# Patient Record
Sex: Female | Born: 1955 | Race: White | Hispanic: No | Marital: Married | Smoking: Former smoker
Health system: Southern US, Community
[De-identification: ages and names within clinical notes are randomized; demographics above are authoritative.]

## PROBLEM LIST (undated history)

## (undated) DIAGNOSIS — F329 Major depressive disorder, single episode, unspecified: Secondary | ICD-10-CM

## (undated) DIAGNOSIS — G43909 Migraine, unspecified, not intractable, without status migrainosus: Secondary | ICD-10-CM

## (undated) DIAGNOSIS — K219 Gastro-esophageal reflux disease without esophagitis: Secondary | ICD-10-CM

## (undated) DIAGNOSIS — H269 Unspecified cataract: Secondary | ICD-10-CM

## (undated) DIAGNOSIS — J449 Chronic obstructive pulmonary disease, unspecified: Secondary | ICD-10-CM

## (undated) DIAGNOSIS — Z8489 Family history of other specified conditions: Secondary | ICD-10-CM

## (undated) DIAGNOSIS — F32A Depression, unspecified: Secondary | ICD-10-CM

## (undated) DIAGNOSIS — F419 Anxiety disorder, unspecified: Secondary | ICD-10-CM

## (undated) HISTORY — DX: Migraine, unspecified, not intractable, without status migrainosus: G43.909

## (undated) HISTORY — DX: Family history of other specified conditions: Z84.89

## (undated) HISTORY — DX: Anxiety disorder, unspecified: F41.9

## (undated) HISTORY — DX: Major depressive disorder, single episode, unspecified: F32.9

## (undated) HISTORY — DX: Unspecified cataract: H26.9

## (undated) HISTORY — DX: Depression, unspecified: F32.A

## (undated) HISTORY — DX: Gastro-esophageal reflux disease without esophagitis: K21.9

## (undated) HISTORY — DX: Chronic obstructive pulmonary disease, unspecified: J44.9

---
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
## 1980-07-01 HISTORY — PX: BREAST ENHANCEMENT SURGERY: SHX7

## 1987-07-02 HISTORY — PX: OTHER SURGICAL HISTORY: SHX169

## 2009-07-13 ENCOUNTER — Ambulatory Visit (HOSPITAL_BASED_OUTPATIENT_CLINIC_OR_DEPARTMENT_OTHER): Admission: RE | Admit: 2009-07-13 | Discharge: 2009-07-13 | Payer: Self-pay | Admitting: Urology

## 2010-07-01 HISTORY — PX: OTHER SURGICAL HISTORY: SHX169

## 2010-09-16 LAB — CBC
HCT: 34.3 % — ABNORMAL LOW (ref 36.0–46.0)
MCV: 90.3 fL (ref 78.0–100.0)
Platelets: 187 10*3/uL (ref 150–400)
RDW: 13.1 % (ref 11.5–15.5)
WBC: 4.6 10*3/uL (ref 4.0–10.5)

## 2010-09-16 LAB — POCT PREGNANCY, URINE: Preg Test, Ur: NEGATIVE

## 2010-09-16 LAB — PROTIME-INR: Prothrombin Time: 13.3 seconds (ref 11.6–15.2)

## 2010-09-16 LAB — ABO/RH: ABO/RH(D): O POS

## 2013-02-01 ENCOUNTER — Encounter: Payer: Self-pay | Admitting: Physician Assistant

## 2013-02-08 ENCOUNTER — Encounter: Payer: Self-pay | Admitting: Physician Assistant

## 2013-02-08 ENCOUNTER — Encounter: Payer: Self-pay | Admitting: Cardiology

## 2013-02-08 ENCOUNTER — Telehealth: Payer: Self-pay | Admitting: Physician Assistant

## 2013-02-08 ENCOUNTER — Encounter: Payer: Self-pay | Admitting: *Deleted

## 2013-02-08 ENCOUNTER — Ambulatory Visit (INDEPENDENT_AMBULATORY_CARE_PROVIDER_SITE_OTHER): Payer: Commercial Indemnity | Admitting: Physician Assistant

## 2013-02-08 VITALS — BP 103/68 | HR 68 | Wt 109.0 lb

## 2013-02-08 DIAGNOSIS — Z136 Encounter for screening for cardiovascular disorders: Secondary | ICD-10-CM

## 2013-02-08 DIAGNOSIS — R079 Chest pain, unspecified: Secondary | ICD-10-CM | POA: Insufficient documentation

## 2013-02-08 MED ORDER — NITROGLYCERIN 0.4 MG SL SUBL: 0.4000 mg | SUBLINGUAL_TABLET | SUBLINGUAL | Status: DC | PRN

## 2013-02-08 MED ORDER — ASPIRIN EC 81 MG PO TBEC: 81.0000 mg | DELAYED_RELEASE_TABLET | Freq: Every day | ORAL | Status: DC

## 2013-02-08 NOTE — Patient Instructions (Addendum)
   Left JV heart cath - see info sheet given  Begin Aspirin 81mg  daily   Nitroglycerin as needed for severe chest pain only - new sent to pharm  Follow up will be given after procedure

## 2013-02-08 NOTE — Telephone Encounter (Signed)
Pt has [employer].  [employer] [id number] expires 04/09/13

## 2013-02-08 NOTE — Assessment & Plan Note (Signed)
Following an extensive discussion with patient and her husband, and in conjunction with Dr. Myrtis Ser, recommendation is as follows: Proceed with diagnostic coronary angiography for definitive exclusion of significant CAD. This will be scheduled for tomorrow, August 12, in the [facility] lab. Although patient has a low probability of significant CAD, with a benign EKG and recent negative cardiac markers, she does present with new onset exertional CP, in the context of CRFs notable for age and history of tobacco. And although the option of evaluating further with a stress test was offered, the patient and her husband are concerned enough that they both prefer the more invasive approach. The risks/benefits of the procedure were discussed, and the patient is agreeable to proceed. She will be placed on low-dose ASA and prn NTG.

## 2013-02-08 NOTE — Telephone Encounter (Signed)
Left JV heart cath - tomorrow, 8/12 -

## 2013-02-08 NOTE — Progress Notes (Signed)
Primary Cardiologist: Jeff Katz, MD (new)   HPI: 57 year-old female, with no known history of heart disease, who presents as an add-on, per Dr. Tapper's request, for evaluation of SOB and CP.  Patient was recently seen here in [facility] ED on August 4, for evaluation of melena. While here, she began experiencing new onset anterior chest pressure with associated dyspnea. Her workup consisted of a negative set of cardiac markers, a CTA of chest negative for pulmonary embolus, and a NL CBC.  Since Monday, she has had recurrent exertional CP and DOE, with occasional LUE numbness, and associated nausea. She characterizes this as moderately intense (5/10), and denies any symptoms at rest.  Her cardiac risk factors are notable for age and history of tobacco. She denies any prior formal cardiac evaluation, or diagnostic testing.   Twelve-lead EKG today, reviewed by me, indicates NSR 68 bpm; NL axis; NSST changes  Allergies  Allergen Reactions  . Penicillins     Current Outpatient Prescriptions  Medication Sig Dispense Refill  . cholecalciferol (VITAMIN D) 1000 UNITS tablet Take 2,000 Units by mouth daily.       . fluticasone (FLONASE) 50 MCG/ACT nasal spray Place 2 sprays into the nose daily.      . loratadine (CLARITIN) 10 MG tablet Take 10 mg by mouth daily.      . naproxen sodium (ANAPROX) 220 MG tablet Take 220 mg by mouth 2 (two) times daily with a meal.      . sertraline (ZOLOFT) 100 MG tablet Take 150 mg by mouth daily.       . SUMAtriptan (IMITREX) 100 MG tablet Take 100 mg by mouth every 2 (two) hours as needed for migraine.      . topiramate (TOPAMAX) 50 MG tablet Take 50 mg by mouth daily.       No current facility-administered medications for this visit.    Past Medical History  Diagnosis Date  . Migraine   . FHx: allergies   . Depression     No past surgical history on file.  History   Social History  . Marital Status: Married    Spouse Name: N/A    Number of Children:  N/A  . Years of Education: N/A   Occupational History  . Not on file.   Social History Main Topics  . Smoking status: Former Smoker  . Smokeless tobacco: Not on file  . Alcohol Use: No  . Drug Use: No  . Sexually Active: Not on file   Other Topics Concern  . Not on file   Social History Narrative  . No narrative on file    No family history on file.  ROS: no nausea, vomiting; no fever, chills; no melena, hematochezia; no claudication  PHYSICAL EXAM: BP 103/68  Pulse 68  Wt 109 lb (49.442 kg) GENERAL: 57 year-old female; NAD HEENT: NCAT, PERRLA, EOMI; sclera clear; no xanthelasma NECK: palpable bilateral carotid pulses, no bruits; no JVD; no TM LUNGS: CTA bilaterally CARDIAC: RRR (S1, S2); no significant murmurs; no rubs or gallops ABDOMEN: soft, non-tender; intact BS EXTREMETIES: intact femoral and distal pulses; no femoral bruits; no significant peripheral edema SKIN: warm/dry; no obvious rash/lesions MUSCULOSKELETAL: no joint deformity NEURO: no focal deficit; NL affect   EKG: reviewed and available in Electronic Records   ASSESSMENT & PLAN:  Chest pain Following an extensive discussion with patient and her husband, and in conjunction with Dr. Katz, recommendation is as follows: Proceed with diagnostic coronary angiography for definitive exclusion   of significant CAD. This will be scheduled for tomorrow, August 12, in the [facility] lab. Although patient has a low probability of significant CAD, with a benign EKG and recent negative cardiac markers, she does present with new onset exertional CP, in the context of CRFs notable for age and history of tobacco. And although the option of evaluating further with a stress test was offered, the patient and her husband are concerned enough that they both prefer the more invasive approach. The risks/benefits of the procedure were discussed, and the patient is agreeable to proceed. She will be placed on low-dose ASA and prn  NTG.    Gene Darreld Hoffer, PAC  

## 2013-02-09 ENCOUNTER — Ambulatory Visit (HOSPITAL_COMMUNITY)
Admission: RE | Admit: 2013-02-09 | Discharge: 2013-02-09 | Disposition: A | Discharge status: Home / Self Care | Payer: Commercial Indemnity | Source: Ambulatory Visit | Attending: Cardiovascular Disease | Admitting: Cardiovascular Disease

## 2013-02-09 ENCOUNTER — Encounter (HOSPITAL_COMMUNITY)
Admission: RE | Disposition: A | Discharge status: Home / Self Care | Payer: Self-pay | Source: Ambulatory Visit | Attending: Cardiovascular Disease

## 2013-02-09 DIAGNOSIS — J309 Allergic rhinitis, unspecified: Secondary | ICD-10-CM | POA: Insufficient documentation

## 2013-02-09 DIAGNOSIS — Z87891 Personal history of nicotine dependence: Secondary | ICD-10-CM | POA: Insufficient documentation

## 2013-02-09 DIAGNOSIS — R0789 Other chest pain: Secondary | ICD-10-CM | POA: Insufficient documentation

## 2013-02-09 DIAGNOSIS — F329 Major depressive disorder, single episode, unspecified: Secondary | ICD-10-CM | POA: Insufficient documentation

## 2013-02-09 DIAGNOSIS — R0602 Shortness of breath: Secondary | ICD-10-CM | POA: Insufficient documentation

## 2013-02-09 DIAGNOSIS — G43909 Migraine, unspecified, not intractable, without status migrainosus: Secondary | ICD-10-CM | POA: Insufficient documentation

## 2013-02-09 DIAGNOSIS — R079 Chest pain, unspecified: Secondary | ICD-10-CM

## 2013-02-09 DIAGNOSIS — Z880 Allergy status to penicillin: Secondary | ICD-10-CM | POA: Insufficient documentation

## 2013-02-09 DIAGNOSIS — F3289 Other specified depressive episodes: Secondary | ICD-10-CM | POA: Insufficient documentation

## 2013-02-09 DIAGNOSIS — Z79899 Other long term (current) drug therapy: Secondary | ICD-10-CM | POA: Insufficient documentation

## 2013-02-09 HISTORY — PX: LEFT AND RIGHT HEART CATHETERIZATION WITH CORONARY ANGIOGRAM: SHX5449

## 2013-02-09 LAB — CBC
MCH: 29.3 pg (ref 26.0–34.0)
MCHC: 34 g/dL (ref 30.0–36.0)
MCV: 86.3 fL (ref 78.0–100.0)
Platelets: 164 10*3/uL (ref 150–400)
RDW: 13.3 % (ref 11.5–15.5)

## 2013-02-09 LAB — POCT I-STAT 3, ART BLOOD GAS (G3+)
Acid-base deficit: 1 mmol/L (ref 0.0–2.0)
O2 Saturation: 89 %
pO2, Arterial: 61 mmHg — ABNORMAL LOW (ref 80.0–100.0)

## 2013-02-09 LAB — BASIC METABOLIC PANEL
BUN: 27 mg/dL — ABNORMAL HIGH (ref 6–23)
CO2: 28 mEq/L (ref 19–32)
Calcium: 9.4 mg/dL (ref 8.4–10.5)
Creatinine, Ser: 0.96 mg/dL (ref 0.50–1.10)
Glucose, Bld: 94 mg/dL (ref 70–99)

## 2013-02-09 LAB — POCT I-STAT 3, VENOUS BLOOD GAS (G3P V)
pCO2, Ven: 53.3 mmHg — ABNORMAL HIGH (ref 45.0–50.0)
pH, Ven: 7.288 (ref 7.250–7.300)
pO2, Ven: 36 mmHg (ref 30.0–45.0)

## 2013-02-09 SURGERY — LEFT AND RIGHT HEART CATHETERIZATION WITH CORONARY ANGIOGRAM
Anesthesia: LOCAL

## 2013-02-09 MED ORDER — ASPIRIN 81 MG PO CHEW
324.0000 mg | CHEWABLE_TABLET | ORAL | Status: AC
  Administered 2013-02-09: 324 mg via ORAL
  Filled 2013-02-09: qty 4

## 2013-02-09 MED ORDER — LIDOCAINE HCL (PF) 1 % IJ SOLN
INTRAMUSCULAR | Status: AC
  Filled 2013-02-09: qty 30

## 2013-02-09 MED ORDER — SODIUM CHLORIDE 0.9 % IV SOLN: INTRAVENOUS | Status: AC

## 2013-02-09 MED ORDER — ONDANSETRON HCL 4 MG/2ML IJ SOLN: 4.0000 mg | Freq: Four times a day (QID) | INTRAMUSCULAR | Status: DC | PRN

## 2013-02-09 MED ORDER — FENTANYL CITRATE 0.05 MG/ML IJ SOLN
INTRAMUSCULAR | Status: AC
  Filled 2013-02-09: qty 2

## 2013-02-09 MED ORDER — SODIUM CHLORIDE 0.9 % IJ SOLN: 3.0000 mL | Freq: Two times a day (BID) | INTRAMUSCULAR | Status: DC

## 2013-02-09 MED ORDER — MIDAZOLAM HCL 2 MG/2ML IJ SOLN
INTRAMUSCULAR | Status: AC
  Filled 2013-02-09: qty 2

## 2013-02-09 MED ORDER — HEPARIN (PORCINE) IN NACL 2-0.9 UNIT/ML-% IJ SOLN
INTRAMUSCULAR | Status: AC
  Filled 2013-02-09: qty 1000

## 2013-02-09 MED ORDER — NITROGLYCERIN 0.2 MG/ML ON CALL CATH LAB
INTRAVENOUS | Status: AC
  Filled 2013-02-09: qty 1

## 2013-02-09 MED ORDER — SODIUM CHLORIDE 0.9 % IV SOLN
Freq: Once | INTRAVENOUS | Status: AC
  Administered 2013-02-09: 16:00:00 via INTRAVENOUS

## 2013-02-09 MED ORDER — SODIUM CHLORIDE 0.9 % IV SOLN: 250.0000 mL | INTRAVENOUS | Status: DC | PRN

## 2013-02-09 MED ORDER — ACETAMINOPHEN 325 MG PO TABS: 650.0000 mg | ORAL_TABLET | ORAL | Status: DC | PRN

## 2013-02-09 MED ORDER — SODIUM CHLORIDE 0.9 % IV SOLN
INTRAVENOUS | Status: DC
  Administered 2013-02-09: 08:00:00 via INTRAVENOUS

## 2013-02-09 MED ORDER — SODIUM CHLORIDE 0.9 % IJ SOLN: 3.0000 mL | INTRAMUSCULAR | Status: DC | PRN

## 2013-02-09 MED ORDER — ASPIRIN 81 MG PO CHEW: 324.0000 mg | CHEWABLE_TABLET | ORAL | Status: DC

## 2013-02-09 NOTE — CV Procedure (Signed)
    Cardiac Catheterization Operative Report  Laurie Garrett [id number] 8/12/201412:23 PM No primary provider on file.  Procedure Performed:  1. Left Heart Catheterization 2. Selective Coronary Angiography 3. Right Heart Catheterization 4. Left ventricular angiogram  Operator: Verne Carrow, MD  Indication:  57 yo female with recent chest pain and SOB. Cardiac cath arranged in our [place] office.                              Procedure Details: The risks, benefits, complications, treatment options, and expected outcomes were discussed with the patient. The patient and/or family concurred with the proposed plan, giving informed consent. The patient was brought to the cath lab after IV hydration was begun and oral premedication was given. The patient was further sedated with Versed and Fentanyl. The right groin was prepped and draped in the usual manner. Using the modified Seldinger access technique, a 5 French sheath was placed in the right femoral artery. A 6 French sheath was inserted into the right femoral vein. A balloon tipped catheter was used to perform a right heart catheterization. Standard diagnostic catheters were used to perform selective coronary angiography. A pigtail catheter was used to perform a left ventricular angiogram. There were no immediate complications. The patient was taken to the recovery area in stable condition.   Hemodynamic Findings: Ao:  102/57            LV: 104/6/9 RA:  3            RV: 18/2/6 PA: 20/7 (mean 13)         PCWP:  5 Fick Cardiac Output: 5.15 L/min Fick Cardiac Index: 2.91 L/min/m2 Central Aortic Saturation: 89% Pulmonary Artery Saturation: 61%  Angiographic Findings:  Left main: No obstructive disease.   Left Anterior Descending Artery: Large caliber vessel that courses to the apex. Several small diagonal branches. No obstructive disease.   Circumflex Artery: Moderate caliber vessel with two small to moderate caliber obtuse  marginal branches.  No obstructive disease.   Right Coronary Artery: Large, dominant vessel. No obstructive disease.   Left Ventricular Angiogram: LVEF=55%.   Impression: 1. No angiographic evidence of CAD 2. Normal LV systolic function 3. Normal right heart pressures.  4. Non-cardiac chest pain  Recommendations: No further ischemic workup.        Complications:  None; patient tolerated the procedure well.

## 2013-02-09 NOTE — H&P (View-Only) (Signed)
Primary Cardiologist: Jerral Bonito, MD (new)   HPI: 57 year-old female, with no known history of heart disease, who presents as an add-on, per Dr. Jackolyn Confer request, for evaluation of SOB and CP.  Patient was recently seen here in [facility] ED on August 4, for evaluation of melena. While here, she began experiencing new onset anterior chest pressure with associated dyspnea. Her workup consisted of a negative set of cardiac markers, a CTA of chest negative for pulmonary embolus, and a NL CBC.  Since Monday, she has had recurrent exertional CP and DOE, with occasional LUE numbness, and associated nausea. She characterizes this as moderately intense (5/10), and denies any symptoms at rest.  Her cardiac risk factors are notable for age and history of tobacco. She denies any prior formal cardiac evaluation, or diagnostic testing.   Twelve-lead EKG today, reviewed by me, indicates NSR 68 bpm; NL axis; NSST changes  Allergies  Allergen Reactions  . Penicillins     Current Outpatient Prescriptions  Medication Sig Dispense Refill  . cholecalciferol (VITAMIN D) 1000 UNITS tablet Take 2,000 Units by mouth daily.       . fluticasone (FLONASE) 50 MCG/ACT nasal spray Place 2 sprays into the nose daily.      Marland Kitchen loratadine (CLARITIN) 10 MG tablet Take 10 mg by mouth daily.      . naproxen sodium (ANAPROX) 220 MG tablet Take 220 mg by mouth 2 (two) times daily with a meal.      . sertraline (ZOLOFT) 100 MG tablet Take 150 mg by mouth daily.       . SUMAtriptan (IMITREX) 100 MG tablet Take 100 mg by mouth every 2 (two) hours as needed for migraine.      . topiramate (TOPAMAX) 50 MG tablet Take 50 mg by mouth daily.       No current facility-administered medications for this visit.    Past Medical History  Diagnosis Date  . Migraine   . FHx: allergies   . Depression     No past surgical history on file.  History   Social History  . Marital Status: Married    Spouse Name: N/A    Number of Children:  N/A  . Years of Education: N/A   Occupational History  . Not on file.   Social History Main Topics  . Smoking status: Former Games developer  . Smokeless tobacco: Not on file  . Alcohol Use: No  . Drug Use: No  . Sexually Active: Not on file   Other Topics Concern  . Not on file   Social History Narrative  . No narrative on file    No family history on file.  ROS: no nausea, vomiting; no fever, chills; no melena, hematochezia; no claudication  PHYSICAL EXAM: BP 103/68  Pulse 68  Wt 109 lb (49.442 kg) GENERAL: 57 year-old female; NAD HEENT: NCAT, PERRLA, EOMI; sclera clear; no xanthelasma NECK: palpable bilateral carotid pulses, no bruits; no JVD; no TM LUNGS: CTA bilaterally CARDIAC: RRR (S1, S2); no significant murmurs; no rubs or gallops ABDOMEN: soft, non-tender; intact BS EXTREMETIES: intact femoral and distal pulses; no femoral bruits; no significant peripheral edema SKIN: warm/dry; no obvious rash/lesions MUSCULOSKELETAL: no joint deformity NEURO: no focal deficit; NL affect   EKG: reviewed and available in Electronic Records   ASSESSMENT & PLAN:  Chest pain Following an extensive discussion with patient and her husband, and in conjunction with Dr. Myrtis Ser, recommendation is as follows: Proceed with diagnostic coronary angiography for definitive exclusion  of significant CAD. This will be scheduled for tomorrow, August 12, in the [facility] lab. Although patient has a low probability of significant CAD, with a benign EKG and recent negative cardiac markers, she does present with new onset exertional CP, in the context of CRFs notable for age and history of tobacco. And although the option of evaluating further with a stress test was offered, the patient and her husband are concerned enough that they both prefer the more invasive approach. The risks/benefits of the procedure were discussed, and the patient is agreeable to proceed. She will be placed on low-dose ASA and prn  NTG.    Gene Avier Jech, PAC

## 2013-02-09 NOTE — Interval H&P Note (Signed)
History and Physical Interval Note:  02/09/2013 11:56 AM  Laurie Garrett  has presented today for cardiac cath with the diagnosis of chest pain and SOB.  The various methods of treatment have been discussed with the patient and family. After consideration of risks, benefits and other options for treatment, the patient has consented to  Procedure(s): LEFT AND RIGHT HEART CATHETERIZATION WITH CORONARY ANGIOGRAM (N/A) as a surgical intervention .  The patient's history has been reviewed, patient examined, no change in status, stable for surgery.  I have reviewed the patient's chart and labs.  Questions were answered to the patient's satisfaction.    Cath Lab Visit (complete for each Cath Lab visit)  Clinical Evaluation Leading to the Procedure:   ACS: no  Non-ACS:    Anginal Classification: CCS I  Anti-ischemic medical therapy: No Therapy  Non-Invasive Test Results: No non-invasive testing performed  Prior CABG: No previous CABG       [name]

## 2013-02-09 NOTE — Discharge Instructions (Signed)
Follow up in [facility] office in 2-3 weeks. Groin Site Care Refer to this sheet in the next few weeks. These instructions provide you with information on caring for yourself after your procedure. Your caregiver may also give you more specific instructions. Your treatment has been planned according to current medical practices, but problems sometimes occur. Call your caregiver if you have any problems or questions after your procedure. HOME CARE INSTRUCTIONS  You may shower 24 hours after the procedure. Remove the bandage (dressing) and gently wash the site with plain soap and water. Gently pat the site dry.  Do not apply powder or lotion to the site.  Do not sit in a bathtub, swimming pool, or whirlpool for 5 to 7 days.  No bending, squatting, or lifting anything over 10 pounds (4.5 kg) as directed by your caregiver.  Inspect the site at least twice daily.  Do not drive home if you are discharged the same day of the procedure. Have someone else drive you.  You may drive 24 hours after the procedure unless otherwise instructed by your caregiver. What to expect:  Any bruising will usually fade within 1 to 2 weeks.  Blood that collects in the tissue (hematoma) may be painful to the touch. It should usually decrease in size and tenderness within 1 to 2 weeks. SEEK IMMEDIATE MEDICAL CARE IF:  You have unusual pain at the groin site or down the affected leg.  You have redness, warmth, swelling, or pain at the groin site.  You have drainage (other than a small amount of blood on the dressing).  You have chills.  You have a fever or persistent symptoms for more than 72 hours.  You have a fever and your symptoms suddenly get worse.  Your leg becomes pale, cool, tingly, or numb.  You have heavy bleeding from the site. Hold pressure on the site. Document Released: 07/20/2010 Document Revised: 09/09/2011 Document Reviewed: 07/20/2010 [facility] Patient Information 2014 [place],  [place].

## 2013-02-25 ENCOUNTER — Ambulatory Visit (INDEPENDENT_AMBULATORY_CARE_PROVIDER_SITE_OTHER): Payer: Commercial Indemnity | Admitting: Physician Assistant

## 2013-02-25 ENCOUNTER — Encounter: Payer: Self-pay | Admitting: Physician Assistant

## 2013-02-25 VITALS — BP 83/56 | HR 79 | Ht 59.0 in | Wt 110.0 lb

## 2013-02-25 DIAGNOSIS — R079 Chest pain, unspecified: Secondary | ICD-10-CM

## 2013-02-25 NOTE — Patient Instructions (Signed)
Continue all current medications. Follow up as needed  

## 2013-02-25 NOTE — Progress Notes (Signed)
Primary Cardiologist: Laurie Bonito, MD   HPI: Patient returns in followup of recent elective R/L heart catheterization, arranged at time of last OV. She had been referred to as an add-on, per Dr. Jackolyn Garrett request, for evaluation of SOB and CP, with no prior history of heart disease.   - Cardiac catheterization, August 12: No angiographic evidence of CAD; NL LVF (EF 55%); NL right heart pressures  She continues to have occasional chest pressure with associated dyspnea. She denies any complications of R groin incision site.  Allergies  Allergen Reactions  . Other Anaphylaxis    AGENT:  Green peppers  . Penicillins Hives    Current Outpatient Prescriptions  Medication Sig Dispense Refill  . cholecalciferol (VITAMIN D) 1000 UNITS tablet Take 2,000 Units by mouth every morning.      . fluticasone (FLONASE) 50 MCG/ACT nasal spray Place 2 sprays into the nose daily.      Marland Kitchen loratadine (CLARITIN) 10 MG tablet Take 10 mg by mouth every morning.       . naproxen sodium (ANAPROX) 220 MG tablet Take 220 mg by mouth 2 (two) times daily with a meal.      . nitroGLYCERIN (NITROSTAT) 0.4 MG SL tablet Place 1 tablet (0.4 mg total) under the tongue every 5 (five) minutes as needed for chest pain.  25 tablet  3  . sertraline (ZOLOFT) 100 MG tablet Take 150 mg by mouth at bedtime.       . SUMAtriptan (IMITREX) 100 MG tablet Take 100 mg by mouth daily as needed for migraine.       . topiramate (TOPAMAX) 50 MG tablet Take 50 mg by mouth 2 (two) times daily.        No current facility-administered medications for this visit.    Past Medical History  Diagnosis Date  . Migraine   . FHx: allergies   . Depression     No past surgical history on file.  History   Social History  . Marital Status: Married    Spouse Name: N/A    Number of Children: N/A  . Years of Education: N/A   Occupational History  . Not on file.   Social History Main Topics  . Smoking status: Former Games developer  . Smokeless tobacco:  Not on file  . Alcohol Use: No  . Drug Use: No  . Sexual Activity: Not on file   Other Topics Concern  . Not on file   Social History Narrative  . No narrative on file    No family history on file.  ROS: no nausea, vomiting; no fever, chills; no melena, hematochezia; no claudication  PHYSICAL EXAM: BP 83/56  Pulse 79  Ht 4\' 11"  (1.499 m)  Wt 110 lb (49.896 kg)  BMI 22.21 kg/m2 GENERAL: 57 year-old female; NAD  HEENT: NCAT, PERRLA, EOMI; sclera clear; no xanthelasma  NECK: palpable bilateral carotid pulses, no bruits; no JVD; no TM  LUNGS: CTA bilaterally  CARDIAC: RRR (S1, S2); no significant murmurs; no rubs or gallops  ABDOMEN: soft, non-tender; intact BS  EXTREMETIES: no significant peripheral edema  SKIN: warm/dry; no obvious rash/lesions  MUSCULOSKELETAL: no joint deformity  NEURO: no focal deficit; NL affect    EKG:    ASSESSMENT & PLAN:  Chest pain We reviewed the results of her recent R/L heart catheterization, which was entirely within normal limits. This provided much needed reassurance to her, particularly in the context of her history of depression, for which she is on medical therapy  and is also followed by a clinical psychologist. Therefore, no further cardiac recommendations are indicated, and patient can resume regularly scheduled followup with her primary care team.    Laurie Garrett, PAC

## 2013-02-25 NOTE — Assessment & Plan Note (Signed)
We reviewed the results of her recent R/L heart catheterization, which was entirely within normal limits. This provided much needed reassurance to her, particularly in the context of her history of depression, for which she is on medical therapy and is also followed by a clinical psychologist. Therefore, no further cardiac recommendations are indicated, and patient can resume regularly scheduled followup with her primary care team.

## 2014-06-09 ENCOUNTER — Encounter (HOSPITAL_COMMUNITY): Payer: Self-pay | Admitting: Cardiovascular Disease

## 2017-07-01 HISTORY — PX: NECK SURGERY: SHX720

## 2017-09-25 ENCOUNTER — Encounter (INDEPENDENT_AMBULATORY_CARE_PROVIDER_SITE_OTHER): Payer: Self-pay | Admitting: Orthopaedic Surgery

## 2017-09-25 ENCOUNTER — Ambulatory Visit (INDEPENDENT_AMBULATORY_CARE_PROVIDER_SITE_OTHER): Payer: Managed Care, Other (non HMO)

## 2017-09-25 ENCOUNTER — Ambulatory Visit (INDEPENDENT_AMBULATORY_CARE_PROVIDER_SITE_OTHER): Payer: Managed Care, Other (non HMO) | Admitting: Orthopaedic Surgery

## 2017-09-25 VITALS — BP 126/80 | HR 92 | Ht 59.0 in | Wt 113.0 lb

## 2017-09-25 DIAGNOSIS — R200 Anesthesia of skin: Secondary | ICD-10-CM

## 2017-09-25 DIAGNOSIS — M542 Cervicalgia: Secondary | ICD-10-CM | POA: Diagnosis not present

## 2017-09-25 DIAGNOSIS — R202 Paresthesia of skin: Secondary | ICD-10-CM | POA: Diagnosis not present

## 2017-09-25 NOTE — Progress Notes (Signed)
Office Visit Note   Patient: Laurie Garrett           Date of Birth: 12/10/1955           MRN: [id number] Visit Date: 09/25/2017              Requested by: No referring provider defined for this encounter. PCP: No primary care provider on file.   Assessment & Plan: Visit Diagnoses:  1. Neck pain   2. Numbness and tingling in right hand     Plan: Patient has cervical spondylosis.  Her physical exam findings suggest she also likely has  Right carpal tunnel syndrome.  Placed in a wrist splint, will set her up for some physical therapy for treatment of her neck and I will see her back again in 4 weeks.  Follow-Up Instructions: Return in about 1 month (around 10/23/2017).   Orders:  Orders Placed This Encounter  Procedures  . XR Cervical Spine 2 or 3 views  . Ambulatory referral to Physical Therapy   No orders of the defined types were placed in this encounter.     Procedures: No procedures performed   Clinical Data: No additional findings.   Subjective: Chief Complaint  Patient presents with  . Neck - Pain    HPI 62 year old female seen with 2863-month history of progressive neck pain with right hand numbness in the C6 to [facility] on the dorsum of the hand that extends to the thumb and index finger.  She is noticed weakness in the arm with tingling.  It bothers her at night and also on a daily basis.  [employer] operational officerecretary.  She is a non-smoker.  She did smoke for about 30 years in the past, most the time to back some more.  Patient does have COPD.  Patient states her right hand wakes her up at night she has to wake up and shake her hand before she is able to fall back to sleep.  She has not worn a splint.  Review of Systems positive for COPD, migraines, cataracts, acid reflux, anxiety depression.   Objective: Vital Signs: BP 126/80   Pulse 92   Ht 4\' 11"  (1.499 m)   Wt 113 lb (51.3 kg)   BMI 22.82 kg/m   Physical Exam  Constitutional: She is oriented to person, place, and  time. She appears well-developed.  HENT:  Head: Normocephalic.  Right Ear: External ear normal.  Left Ear: External ear normal.  Eyes: Pupils are equal, round, and reactive to light.  Neck: No tracheal deviation present. No thyromegaly present.  Cardiovascular: Normal rate.  Pulmonary/Chest: Effort normal.  Abdominal: Soft.  Neurological: She is alert and oriented to person, place, and time.  Skin: Skin is warm and dry.  Psychiatric: She has a normal mood and affect. Her behavior is normal.    Ortho Exam patient has brachial plexus tenderness on the right positive Spurling on the right.  Knee reflexes are 2+ and symmetrical discomfort with forward flexion of her neck as well as extension.  Compression.  Wrist flexion extension biceps triceps are symmetrical.  Normal heel toe gait no lower extremity hyperreflexia.  Patient has pain with carpal compression positive Phalen's test on the right negative on the left.  No thenar atrophy.  Specialty Comments:  No specialty comments available.  Imaging: Xr Cervical Spine 2 Or 3 Views  Result Date: 09/25/2017 And lateral C-spine x-rays demonstrate loss of normal cervical lordosis.  2 mm anterolisthesis at C4-5.  Greater than  50% disc space narrowing at 5 6 C6-7 with posterior spurs at 5 6 and C6-7.  Negative for acute changes. Impression: Cervical spondylosis with 3 level disc space narrowing with spurring C5-6 C6-7.  Anterolisthesis 2 mm at C4-5.    PMFS History: Patient Active Problem List   Diagnosis Date Noted  . Chest pain 02/08/2013   Past Medical History:  Diagnosis Date  . Acid reflux   . Anxiety   . Cataracts, bilateral   . COPD (chronic obstructive pulmonary disease) (HCC)   . Depression   . Depression   . FHx: allergies   . Migraine   . Migraines     No family history on file.  Past Surgical History:  Procedure Laterality Date  . BREAST ENHANCEMENT SURGERY  1982  . LEFT AND RIGHT HEART CATHETERIZATION WITH CORONARY  ANGIOGRAM N/A 02/09/2013   Procedure: LEFT AND RIGHT HEART CATHETERIZATION WITH CORONARY ANGIOGRAM;  Surgeon: Kathleene Hazel, MD;  Location: [facility] CATH LAB;  Service: Cardiovascular;  Laterality: N/A;  . TUBAL LIGATION  1978  . tubal ligation reversal  1989  . vaginal sling  2012   Social History   Occupational History  . Not on file  Tobacco Use  . Smoking status: Former Games developer  . Smokeless tobacco: Never Used  Substance and Sexual Activity  . Alcohol use: Yes    Comment: rare  . Drug use: No  . Sexual activity: Not on file

## 2017-10-23 ENCOUNTER — Encounter (INDEPENDENT_AMBULATORY_CARE_PROVIDER_SITE_OTHER): Payer: Self-pay | Admitting: Orthopaedic Surgery

## 2017-10-23 ENCOUNTER — Ambulatory Visit (INDEPENDENT_AMBULATORY_CARE_PROVIDER_SITE_OTHER): Payer: Managed Care, Other (non HMO) | Admitting: Orthopaedic Surgery

## 2017-10-23 VITALS — BP 118/94 | HR 98 | Ht 59.0 in | Wt 113.0 lb

## 2017-10-23 DIAGNOSIS — M47812 Spondylosis without myelopathy or radiculopathy, cervical region: Secondary | ICD-10-CM | POA: Diagnosis not present

## 2017-10-23 NOTE — Progress Notes (Signed)
Office Visit Note   Patient: Laurie Garrett           Date of Birth: August 28, 1955           MRN: [id number] Visit Date: 10/23/2017              Requested by: No referring provider defined for this encounter. PCP: No primary care provider on file.   Assessment & Plan: Visit Diagnoses: Cervical spondylosis  Plan: Patient improved with physical therapy and is gotten good improvement 75%.  I will recheck her again in 4 months.  She can return earlier if she has significant increase in symptoms.  Follow-Up Instructions: No follow-ups on file.   Orders:  No orders of the defined types were placed in this encounter.  No orders of the defined types were placed in this encounter.     Procedures: No procedures performed   Clinical Data: No additional findings.   Subjective: Chief Complaint  Patient presents with  . Neck - Pain, Follow-up    HPI 62 year old female returns with ongoing problems with neck and right arm pain.  She is been using a night splint for carpal tunnel syndrome and is been through physical therapy for cervical spondylosis.  She had some anterolisthesis at C4-5 and disc space narrowing at C5-6 and C6-7.  She states therapy has been very effective and she is better than when I saw her in March.  She is happy with the results of treatment and activities of daily living are improved.  Review of Systems 14 point review of systems updated and is unchanged from 09/25/2017 office visit.   Objective: Vital Signs: BP (!) 118/94   Pulse 98   Ht 4\' 11"  (1.499 m)   Wt 113 lb (51.3 kg)   BMI 22.82 kg/m   Physical Exam  Constitutional: She is oriented to person, place, and time. She appears well-developed.  HENT:  Head: Normocephalic.  Right Ear: External ear normal.  Left Ear: External ear normal.  Eyes: Pupils are equal, round, and reactive to light.  Neck: No tracheal deviation present. No thyromegaly present.  Cardiovascular: Normal rate.  Pulmonary/Chest:  Effort normal.  Abdominal: Soft.  Neurological: She is alert and oriented to person, place, and time.  Skin: Skin is warm and dry.  Psychiatric: She has a normal mood and affect. Her behavior is normal.    Ortho Exam   some pain with carpal compression she has improved cervical range of motion.  She has some pain with extremes of extension but still has good extension and flexion chin to chest.  Reflexes are 2+ and symmetrical normal heel toe gait. Specialty Comments:  No specialty comments available.  Imaging: No results found.   PMFS History: Patient Active Problem List   Diagnosis Date Noted  . Chest pain 02/08/2013   Past Medical History:  Diagnosis Date  . Acid reflux   . Anxiety   . Cataracts, bilateral   . COPD (chronic obstructive pulmonary disease) (HCC)   . Depression   . Depression   . FHx: allergies   . Migraine   . Migraines     No family history on file.  Past Surgical History:  Procedure Laterality Date  . BREAST ENHANCEMENT SURGERY  1982  . LEFT AND RIGHT HEART CATHETERIZATION WITH CORONARY ANGIOGRAM N/A 02/09/2013   Procedure: LEFT AND RIGHT HEART CATHETERIZATION WITH CORONARY ANGIOGRAM;  Surgeon: Kathleene Hazelhristopher D McAlhany, MD;  [facility];  Service: Cardiovascular;  Laterality: N/A;  .  TUBAL LIGATION  1978  . tubal ligation reversal  1989  . vaginal sling  2012   Social History   Occupational History  . Not on file  Tobacco Use  . Smoking status: Former Games developer  . Smokeless tobacco: Never Used  Substance and Sexual Activity  . Alcohol use: Yes    Comment: rare  . Drug use: No  . Sexual activity: Not on file

## 2017-12-18 ENCOUNTER — Encounter (INDEPENDENT_AMBULATORY_CARE_PROVIDER_SITE_OTHER): Payer: Self-pay | Admitting: Orthopaedic Surgery

## 2017-12-18 ENCOUNTER — Ambulatory Visit (INDEPENDENT_AMBULATORY_CARE_PROVIDER_SITE_OTHER): Payer: Managed Care, Other (non HMO) | Admitting: Orthopaedic Surgery

## 2017-12-18 VITALS — BP 100/53 | HR 103 | Ht 59.0 in | Wt 108.0 lb

## 2017-12-18 DIAGNOSIS — M47812 Spondylosis without myelopathy or radiculopathy, cervical region: Secondary | ICD-10-CM | POA: Diagnosis not present

## 2017-12-18 NOTE — Addendum Note (Signed)
Addended by: Rogers SeedsYEATTS, Riyan Haile M on: 12/18/2017 03:42 PM   Modules accepted: Orders

## 2017-12-18 NOTE — Progress Notes (Signed)
Office Visit Note   Patient: Laurie Garrett           Date of Birth: Nov 26, 1955           MRN: [id number] Visit Date: 12/18/2017              Requested by: No referring provider defined for this encounter. PCP: No primary care provider on file.   Assessment & Plan: Visit Diagnoses:  1. Spondylosis without myelopathy or radiculopathy, cervical region     Plan: Patient had persistent symptoms with cervical spondylosis and right arm symptoms which seem to be getting worse with tingling.  She is had some occasional pain that lasts for period time that she rates as severe.  She is been through therapy with slight improvement she is taking 2 Aleve twice a day with food.  We will set her up for cervical MRI scan at her request due to her ongoing symptoms in office follow-up after scan for review.  Follow-Up Instructions: No follow-ups on file.   Orders:  No orders of the defined types were placed in this encounter.  No orders of the defined types were placed in this encounter.     Procedures: No procedures performed   Clinical Data: No additional findings.   Subjective: Chief Complaint  Patient presents with  . Right Arm - Pain, Follow-up    HPI 62 year old female returns with ongoing problems with neck pain and right arm pain.  In March showed anterolisthesis of C4-5 and disc space narrowing at C5-6 C6-7.  She is gotten some improvement with therapy and is just finished with therapy.  At times she feels sharp pain in her right arm.  It bothers her with a activities of daily living.  She states she states it wakes her up at night particularly if she does not wear her brace on her neck.  Review of Systems point review of systems positive for COPD, migraines, cataracts, acid reflux, anxiety and depression otherwise negative as it pertains HPI.   Objective: Vital Signs: BP (!) 100/53   Pulse (!) 103   Ht 4\' 11"  (1.499 m)   Wt 108 lb (49 kg)   BMI 21.81 kg/m   Physical  Exam  Constitutional: She is oriented to person, place, and time. She appears well-developed.  HENT:  Head: Normocephalic.  Right Ear: External ear normal.  Left Ear: External ear normal.  Eyes: Pupils are equal, round, and reactive to light.  Neck: No tracheal deviation present. No thyromegaly present.  Cardiovascular: Normal rate.  Pulmonary/Chest: Effort normal.  Abdominal: Soft.  Neurological: She is alert and oriented to person, place, and time.  Skin: Skin is warm and dry.  Psychiatric: She has a normal mood and affect. Her behavior is normal.    Ortho Exam patient has some brachial plexus tenderness.  Upper extremity reflexes lower extremity reflexes are 3+ and symmetrical no atrophy.  He has some forearm areas from previous scarring from her cat.  Positive brachial plexus tenderness.  Some mild positive Spurling mild to moderate right and left.  Normal lower extremity reflexes she has tenderness palpation lumbosacral junction and both sciatic notch right and left minimal trochanteric bursal tenderness.  Specialty Comments:  No specialty comments available.  Imaging: No results found.   PMFS History: Patient Active Problem List   Diagnosis Date Noted  . Chest pain 02/08/2013   Past Medical History:  Diagnosis Date  . Acid reflux   . Anxiety   . Cataracts,  bilateral   . COPD (chronic obstructive pulmonary disease) (HCC)   . Depression   . Depression   . FHx: allergies   . Migraine   . Migraines     No family history on file.  Past Surgical History:  Procedure Laterality Date  . BREAST ENHANCEMENT SURGERY  1982  . LEFT AND RIGHT HEART CATHETERIZATION WITH CORONARY ANGIOGRAM N/A 02/09/2013   Procedure: LEFT AND RIGHT HEART CATHETERIZATION WITH CORONARY ANGIOGRAM;  Surgeon: Kathleene Hazelhristopher D McAlhany, MD;  [facility];  Service: Cardiovascular;  Laterality: N/A;  . TUBAL LIGATION  1978  . tubal ligation reversal  1989  . vaginal sling  2012   Social History    Occupational History  . Not on file  Tobacco Use  . Smoking status: Former Games developermoker  . Smokeless tobacco: Never Used  Substance and Sexual Activity  . Alcohol use: Yes    Comment: rare  . Drug use: No  . Sexual activity: Not on file

## 2017-12-24 ENCOUNTER — Telehealth (INDEPENDENT_AMBULATORY_CARE_PROVIDER_SITE_OTHER): Payer: Self-pay | Admitting: Radiology

## 2017-12-24 NOTE — Telephone Encounter (Signed)
Received call from [place] in [place] office that [employer] has denied MRI cervical spine for patient. They did not leave a reason.  Per Dr. Ophelia CharterYates, call patient to advise of denial. Explain to her that she can call insurance to find out why they denied and what she needs to do. She has had previous physical therapy, medications, etc. She has failed conservative treatment.   I left voicemail for patient to return my call. [phone]

## 2017-12-25 ENCOUNTER — Ambulatory Visit (INDEPENDENT_AMBULATORY_CARE_PROVIDER_SITE_OTHER): Payer: Managed Care, Other (non HMO) | Admitting: Orthopaedic Surgery

## 2017-12-25 NOTE — Telephone Encounter (Signed)
[employer] letter states that it is denying due to patient not having EMG/NCS. Per Dr. Ophelia CharterYates, order this if patient is agreeable.

## 2017-12-26 NOTE — Telephone Encounter (Signed)
noted 

## 2017-12-26 NOTE — Telephone Encounter (Signed)
Fyi.  Received call from [place] in [place] office today. She states that [employer] has now approved MRI Cervical Spine. Faxed order back to Jules[place]arlotte to schedule.

## 2017-12-26 NOTE — Telephone Encounter (Signed)
OK ROV after scan thanks 

## 2018-01-08 ENCOUNTER — Ambulatory Visit (INDEPENDENT_AMBULATORY_CARE_PROVIDER_SITE_OTHER): Payer: Managed Care, Other (non HMO) | Admitting: Orthopaedic Surgery

## 2018-01-08 ENCOUNTER — Encounter (INDEPENDENT_AMBULATORY_CARE_PROVIDER_SITE_OTHER): Payer: Self-pay | Admitting: Orthopaedic Surgery

## 2018-01-08 VITALS — BP 101/64 | HR 104 | Ht <= 58 in | Wt 115.0 lb

## 2018-01-08 DIAGNOSIS — M4802 Spinal stenosis, cervical region: Secondary | ICD-10-CM | POA: Diagnosis not present

## 2018-01-08 DIAGNOSIS — M4722 Other spondylosis with radiculopathy, cervical region: Secondary | ICD-10-CM

## 2018-01-13 ENCOUNTER — Encounter (INDEPENDENT_AMBULATORY_CARE_PROVIDER_SITE_OTHER): Payer: Self-pay | Admitting: Orthopaedic Surgery

## 2018-01-13 DIAGNOSIS — M4722 Other spondylosis with radiculopathy, cervical region: Secondary | ICD-10-CM | POA: Insufficient documentation

## 2018-01-13 DIAGNOSIS — M4802 Spinal stenosis, cervical region: Secondary | ICD-10-CM | POA: Insufficient documentation

## 2018-01-13 NOTE — Progress Notes (Signed)
Office Visit Note   Patient: Laurie Garrett           Date of Birth: Sep 28, 1955           MRN: [id number] Visit Date: 01/08/2018              Requested by: No referring provider defined for this encounter. PCP: Patient, No Pcp Per   Assessment & Plan: Visit Diagnoses:  1. Other spondylosis with radiculopathy, cervical region   2. Spinal stenosis of cervical region     Plan: Patient has cervical spondylosis with stenosis at C5-6 C6-7 is been through anti-inflammatories prednisone, physical therapy, bracing with failure to respond to treatment.  Discussed 2 level cervical fusion at C5-6 C6-7 with overnight stay in the hospital, use of an allograft and plate.  Risks of surgery discussed including possibility of pseudoarthrosis with need for posterior fusion if she was symptomatic.  Dysphasia dysphonia use of a soft collar for 6 weeks.  She understands she would be driving for 6 weeks.  Questions were elicited and answered she understands and requests we proceed.  Follow-Up Instructions: No follow-ups on file.   Orders:  No orders of the defined types were placed in this encounter.  No orders of the defined types were placed in this encounter.     Procedures: No procedures performed   Clinical Data: No additional findings.   Subjective: Chief Complaint  Patient presents with  . Neck - Pain, Follow-up    MRI review    HPI 62 year old female returns with ongoing problems with cervical stenosis and cervical spinal stenosis.  She is had greater than 41-month history of progressive neck and right arm pain with intermittent severe symptoms not responsive to anti-inflammatories.  She got slight improvement with physical therapy and then increased symptoms.  She is used a brace intermittently particularly at night.  Review of Systems 14 point review of systems updated.  Positive for COPD, migraines, cataracts, acid reflux, breast augmentation, anxiety and depression, cervical  spondylosis with stenosis.  Past smoker 30-pack-year history.   Objective: Vital Signs: BP 101/64   Pulse (!) 104   Ht 4\' 10"  (1.473 m)   Wt 115 lb (52.2 kg)   BMI 24.04 kg/m   Physical Exam  Constitutional: She is oriented to person, place, and time. She appears well-developed.  HENT:  Head: Normocephalic.  Right Ear: External ear normal.  Left Ear: External ear normal.  Eyes: Pupils are equal, round, and reactive to light.  Neck: No tracheal deviation present. No thyromegaly present.  Cardiovascular: Normal rate.  Pulmonary/Chest: Effort normal.  Abdominal: Soft.  Neurological: She is alert and oriented to person, place, and time.  Skin: Skin is warm and dry.  Psychiatric: She has a normal mood and affect. Her behavior is normal.    Ortho Exam increased pain with cervical compression slight relief with distraction.  More brachial plexus tenderness on the right than left.  Moderate Spurling on the right negative on the left.  Upper extremity reflexes are 2+ symmetrical.  Knee and ankle jerk are 2+.  No lower extremity clonus.  Normal heel toe gait.  Negative Romberg.  She has forward flexion chin 3 fingerbreadths chin to chest pain with cervical extension.  Specialty Comments:  No specialty comments available.  Imaging: Cervical MRI scan is reviewed.  This shows cervical spondylosis with stenosis at C5-6 C6-7 disc degeneration.  Trace anterolisthesis at C4-5 without stenosis.   PMFS History: Patient Active Problem List  Diagnosis Date Noted  . Other spondylosis with radiculopathy, cervical region 01/13/2018  . Spinal stenosis of cervical region 01/13/2018  . Chest pain 02/08/2013   Past Medical History:  Diagnosis Date  . Acid reflux   . Anxiety   . Cataracts, bilateral   . COPD (chronic obstructive pulmonary disease) (HCC)   . Depression   . Depression   . FHx: allergies   . Migraine   . Migraines     History reviewed. No pertinent family history.  Past  Surgical History:  Procedure Laterality Date  . BREAST ENHANCEMENT SURGERY  1982  . LEFT AND RIGHT HEART CATHETERIZATION WITH CORONARY ANGIOGRAM N/A 02/09/2013   Procedure: LEFT AND RIGHT HEART CATHETERIZATION WITH CORONARY ANGIOGRAM;  Surgeon: Kathleene Hazelhristopher D McAlhany, MD;  [facility];  Service: Cardiovascular;  Laterality: N/A;  . TUBAL LIGATION  1978  . tubal ligation reversal  1989  . vaginal sling  2012   Social History   Occupational History  . Not on file  Tobacco Use  . Smoking status: Former Games developermoker  . Smokeless tobacco: Never Used  Substance and Sexual Activity  . Alcohol use: Yes    Comment: rare  . Drug use: No  . Sexual activity: Not on file

## 2018-01-30 ENCOUNTER — Telehealth (INDEPENDENT_AMBULATORY_CARE_PROVIDER_SITE_OTHER): Payer: Self-pay | Admitting: Orthopaedic Surgery

## 2018-01-30 NOTE — Telephone Encounter (Signed)
[facility] [place]. I called patient's contact number and spoke with Tasia Catchingsraig (pt's spouse).  He states that she has not quite madeup her mind to have neck surgery with Dr.Yates at this time and is considering a second opinion. Tasia CatchingsCraig advise me to send  a message throught email providing her with my name and direct number.  I will wait to hear from patient.

## 2019-10-04 ENCOUNTER — Encounter: Payer: Self-pay | Admitting: "Endocrinology

## 2019-10-04 LAB — TSH: TSH: 8.34 — AB (ref 0.41–5.90)

## 2019-11-04 DIAGNOSIS — Z1331 Encounter for screening for depression: Secondary | ICD-10-CM | POA: Diagnosis not present

## 2019-11-04 DIAGNOSIS — F900 Attention-deficit hyperactivity disorder, predominantly inattentive type: Secondary | ICD-10-CM | POA: Diagnosis not present

## 2019-11-04 DIAGNOSIS — F320 Major depressive disorder, single episode, mild: Secondary | ICD-10-CM | POA: Diagnosis not present

## 2019-11-04 DIAGNOSIS — Z6826 Body mass index (BMI) 26.0-26.9, adult: Secondary | ICD-10-CM | POA: Diagnosis not present

## 2019-11-04 DIAGNOSIS — J309 Allergic rhinitis, unspecified: Secondary | ICD-10-CM | POA: Diagnosis not present

## 2019-11-10 ENCOUNTER — Encounter: Payer: Self-pay | Admitting: "Endocrinology

## 2019-11-10 ENCOUNTER — Ambulatory Visit (INDEPENDENT_AMBULATORY_CARE_PROVIDER_SITE_OTHER): Payer: BC Managed Care – PPO | Admitting: "Endocrinology

## 2019-11-10 ENCOUNTER — Other Ambulatory Visit: Payer: Self-pay

## 2019-11-10 VITALS — BP 108/68 | HR 73 | Ht 59.0 in | Wt 129.8 lb

## 2019-11-10 DIAGNOSIS — E039 Hypothyroidism, unspecified: Secondary | ICD-10-CM

## 2019-11-10 MED ORDER — LEVOTHYROXINE SODIUM 25 MCG PO TABS: 25.0000 ug | ORAL_TABLET | Freq: Every day | ORAL | 2 refills | Status: DC

## 2019-11-10 MED ORDER — LEVOTHYROXINE SODIUM 88 MCG PO TABS: 88.0000 ug | ORAL_TABLET | Freq: Every day | ORAL | 3 refills | Status: DC

## 2019-11-10 NOTE — Progress Notes (Signed)
Endocrinology Consult Note                                         11/10/2019, 5:55 PM   Laurie Garrett is a 64 y.o.-year-old female patient being seen in consultation for hypothyroidism referred by Patient, No Pcp Per.   Past Medical History:  Diagnosis Date  . Acid reflux   . Anxiety   . Cataracts, bilateral   . COPD (chronic obstructive pulmonary disease) (HCC)   . Depression   . Depression   . FHx: allergies   . Migraine   . Migraines     Past Surgical History:  Procedure Laterality Date  . BREAST ENHANCEMENT SURGERY  1982  . LEFT AND RIGHT HEART CATHETERIZATION WITH CORONARY ANGIOGRAM N/A 02/09/2013   Procedure: LEFT AND RIGHT HEART CATHETERIZATION WITH CORONARY ANGIOGRAM;  Surgeon: Kathleene Hazel, MD;  Location: [facility] C[facility] LAB;  Service: Cardiovascular;  Laterality: N/A;  . TUBAL LIGATION  1978  . tubal ligation reversal  1989  . vaginal sling  2012    Social History   Socioeconomic History  . Marital status: Married    Spouse name: Not on file  . Number of children: Not on file  . Years of education: Not on file  . Highest education level: Not on file  Occupational History  . Not on file  Tobacco Use  . Smoking status: Former Games developer  . Smokeless tobacco: Never Used  Substance and Sexual Activity  . Alcohol use: Yes    Comment: rare  . Drug use: No  . Sexual activity: Not on file  Other Topics Concern  . Not on file  Social History Narrative  . Not on file   Social Determinants of Health   Financial Resource Strain:   . Difficulty of Paying Living Expenses:   Food Insecurity:   . Worried About Programme researcher, broadcasting/film/video in the Last Year:   . Barista in the Last Year:   Transportation Needs:   . Freight forwarder (Medical):   Marland Kitchen Lack of Transportation (Non-Medical):   Physical Activity:   . Days of Exercise per Week:   . Minutes of Exercise per Session:    Stress:   . Feeling of Stress :   Social Connections:   . Frequency of Communication with Friends and Family:   . Frequency of Social Gatherings with Friends and Family:   . Attends Religious Services:   . Active Member of Clubs or Organizations:   . Attends Banker Meetings:   Marland Kitchen Marital Status:     Family History  Problem Relation Age of Onset  . Thyroid disease Mother   . Cancer Mother   . Cancer Father     Outpatient Encounter Medications as of 11/10/2019  Medication Sig  . methylphenidate 36 MG PO CR tablet Take 36 mg by mouth daily.  . cholecalciferol (VITAMIN D) 1000 UNITS tablet Take  2,000 Units by mouth every morning.  Marland Kitchen EPINEPHrine 0.15 MG/0.15ML IJ injection Inject as directed as needed.  . fluticasone (FLONASE) 50 MCG/ACT nasal spray Place 2 sprays into the nose daily.  Marland Kitchen levothyroxine (SYNTHROID) 25 MCG tablet Take 1 tablet (25 mcg total) by mouth daily before breakfast.  . montelukast (SINGULAIR) 10 MG tablet   . naproxen sodium (ANAPROX) 220 MG tablet Take 220 mg by mouth 2 (two) times daily with a meal.  . sertraline (ZOLOFT) 100 MG tablet Take 150 mg by mouth at bedtime.   . [DISCONTINUED] amphetamine-dextroamphetamine (ADDERALL) 15 MG tablet   . [DISCONTINUED] fluticasone-salmeterol (ADVAIR HFA) 115-21 MCG/ACT inhaler   . [DISCONTINUED] levothyroxine (SYNTHROID) 88 MCG tablet Take 1 tablet (88 mcg total) by mouth daily before breakfast.  . [DISCONTINUED] loratadine (CLARITIN) 10 MG tablet Take 10 mg by mouth every morning.   . [DISCONTINUED] nitroGLYCERIN (NITROSTAT) 0.4 MG SL tablet Place 1 tablet (0.4 mg total) under the tongue every 5 (five) minutes as needed for chest pain. (Patient not taking: Reported on 09/25/2017)  . [DISCONTINUED] SUMAtriptan (IMITREX) 100 MG tablet Take 100 mg by mouth daily as needed for migraine.   . [DISCONTINUED] topiramate (TOPAMAX) 50 MG tablet Take 50 mg by mouth 2 (two) times daily.    No facility-administered  encounter medications on file as of 11/10/2019.    ALLERGIES: Allergies  Allergen Reactions  . Doxycycline Hives  . Other Anaphylaxis    AGENT:  Green peppers  . Penicillins Hives   VACCINATION STATUS:  There is no immunization history on file for this patient.   HPI    Laurie Garrett  is a patient with the above medical history. -History is obtained directly from the patient.  She reports that 2 to 3 years ago her thyroid function test was abnormal, however was not offered any treatment.  Most recently she was found to have high TSH of 8.3 on October 04, 2019 with no corresponding T4 nor T3.  On blood work done because of concerns of 20+ pounds of weight gain over the last year, fatigue, and cold intolerance. She has family history of what appears to be hypothyroidism in her mother.  She denies family history of thyroid cancer, although there is significant family history of multiple different cancers.  She denies  voice change. She denies any radiation to the neck.  She also describes dry skin and hair loss.  Pt denies feeling nodules in neck, hoarseness, dysphagia/odynophagia, SOB with lying down. No recent use of iodine supplements.   ROS:  Constitutional: + weight gain, + fatigue, no subjective hyperthermia, no subjective hypothermia Eyes: no blurry vision, no xerophthalmia ENT: no sore throat, no nodules palpated in throat, no dysphagia/odynophagia, no hoarseness Cardiovascular: no Chest Pain, no Shortness of Breath, no palpitations, no leg swelling Respiratory: no cough, no SOB Gastrointestinal: no Nausea/Vomiting/Diarhhea Musculoskeletal: no muscle/joint aches Skin: no rashes, + dry skin Neurological: no tremors, no numbness, no tingling, no dizziness Psychiatric: no depression, no anxiety   Physical Exam: BP 108/68   Pulse 73   Ht 4\' 11"  (1.499 m)   Wt 129 lb 12.8 oz (58.9 kg)   BMI 26.22 kg/m  Wt Readings from Last 3 Encounters:  11/10/19 129 lb 12.8 oz  (58.9 kg)  01/08/18 115 lb (52.2 kg)  12/18/17 108 lb (49 kg)    Constitutional:  Body mass index is 26.22 kg/m., not in acute distress, normal state of mind Eyes: PERRLA, EOMI, no exophthalmos ENT: moist mucous  membranes, + palpable thyroid, no cervical lymphadenopathy Cardiovascular: normal precordial activity, Regular Rate and Rhythm, no Murmur/Rubs/Gallops Respiratory:  adequate breathing efforts, no gross chest deformity, Clear to auscultation bilaterally Gastrointestinal: abdomen soft, Non -tender, No distension, Bowel Sounds present Musculoskeletal: no gross deformities, strength intact in all four extremities Skin: moist, warm, no rashes Neurological: no tremor with outstretched hands, Deep tendon reflexes normal in all four extremities.   CMP ( most recent) CMP     Component Value Date/Time   NA 142 02/09/2013 0759   K 3.8 02/09/2013 0759   CL 107 02/09/2013 0759   CO2 28 02/09/2013 0759   GLUCOSE 94 02/09/2013 0759   BUN 27 (H) 02/09/2013 0759   CREATININE 0.96 02/09/2013 0759   CALCIUM 9.4 02/09/2013 0759   GFRNONAA 65 (L) 02/09/2013 0759   GFRAA 76 (L) 02/09/2013 0759    Lab Results  Component Value Date   TSH 8.34 (A) 10/04/2019      ASSESSMENT: 1. Hypothyroidism  PLAN:    Patient with recent diagnosis of hypothyroidism.  She would benefit from early initiation of thyroid hormone supplement.  I discussed and prescribed levothyroxine 25 mcg p.o. daily before breakfast.    - We discussed about correct intake of levothyroxine, at fasting, with water, separated by at least 30 minutes from breakfast, and separated by more than 4 hours from calcium, iron, multivitamins, acid reflux medications (PPIs). -Patient is made aware of the fact that thyroid hormone replacement is needed for life, dose to be adjusted by periodic monitoring of thyroid function tests. - Will check thyroid tests before next visit: TSH, free T4 -Due to palpable thyroid on physical exam, she  is offered baseline thyroid ultrasound.     - Time spent with the patient: 60 minutes, of which >50% was spent in obtaining information about her symptoms, reviewing her previous labs, evaluations, and treatments, counseling her about her hypothyroidism, and developing a plan to confirm the diagnosis and long term treatment as necessary. Please refer to " Patient Self Inventory" in the Media  tab for reviewed elements of pertinent patient history.  Mayer Camel Eimer participated in the discussions, expressed understanding, and voiced agreement with the above plans.  All questions were answered to her satisfaction. she is encouraged to contact clinic should she have any questions or concerns prior to her return visit.  Return in about 5 weeks (around 12/15/2019) for F/U with Pre-visit Labs, Thyroid / Neck Ultrasound.  Marquis Lunch, MD [facility] [address] [place], [place] [address] Phone: [phone]  Fax: [phone]   11/10/2019, 5:55 PM  This note was partially dictated with voice recognition software. Similar sounding words can be transcribed inadequately or may not  be corrected upon review.

## 2019-11-11 ENCOUNTER — Other Ambulatory Visit: Payer: Self-pay

## 2019-11-11 MED ORDER — LEVOTHYROXINE SODIUM 25 MCG PO TABS: 25.0000 ug | ORAL_TABLET | Freq: Every day | ORAL | 2 refills | Status: DC

## 2019-11-18 ENCOUNTER — Other Ambulatory Visit: Payer: Self-pay

## 2019-11-18 ENCOUNTER — Ambulatory Visit (HOSPITAL_COMMUNITY)
Admission: RE | Admit: 2019-11-18 | Discharge: 2019-11-18 | Disposition: A | Discharge status: Home / Self Care | Payer: BC Managed Care – PPO | Source: Ambulatory Visit | Attending: "Endocrinology | Admitting: "Endocrinology

## 2019-11-18 DIAGNOSIS — E049 Nontoxic goiter, unspecified: Secondary | ICD-10-CM | POA: Diagnosis not present

## 2019-11-18 DIAGNOSIS — E039 Hypothyroidism, unspecified: Secondary | ICD-10-CM | POA: Diagnosis not present

## 2019-12-20 ENCOUNTER — Ambulatory Visit: Payer: BC Managed Care – PPO | Admitting: "Endocrinology

## 2020-01-14 LAB — THYROGLOBULIN ANTIBODY: Thyroglobulin Ab: <1 IU/mL (ref <=1)

## 2020-01-14 LAB — TSH: TSH: 3.92 mIU/L (ref 0.40–4.50)

## 2020-01-14 LAB — T4, FREE: Free T4: 1.1 ng/dL (ref 0.8–1.8)

## 2020-01-14 LAB — THYROID PEROXIDASE ANTIBODY: Thyroperoxidase Ab SerPl-aCnc: 1 IU/mL (ref <9)

## 2020-01-21 ENCOUNTER — Ambulatory Visit (INDEPENDENT_AMBULATORY_CARE_PROVIDER_SITE_OTHER): Payer: BC Managed Care – PPO | Admitting: "Endocrinology

## 2020-01-21 ENCOUNTER — Encounter: Payer: Self-pay | Admitting: "Endocrinology

## 2020-01-21 ENCOUNTER — Other Ambulatory Visit: Payer: Self-pay

## 2020-01-21 VITALS — BP 116/70 | HR 84 | Ht 59.0 in | Wt 128.6 lb

## 2020-01-21 DIAGNOSIS — E039 Hypothyroidism, unspecified: Secondary | ICD-10-CM | POA: Diagnosis not present

## 2020-01-21 MED ORDER — LEVOTHYROXINE SODIUM 50 MCG PO TABS: 50.0000 ug | ORAL_TABLET | Freq: Every day | ORAL | 1 refills | Status: DC

## 2020-01-21 NOTE — Progress Notes (Signed)
01/21/2020, 9:06 AM   Endocrinology follow-up note  Laurie Garrett is a 64 y.o.-year-old female patient being seen in consultation for hypothyroidism referred by Lawerance Sabal, [place].   Past Medical History:  Diagnosis Date  . Acid reflux   . Anxiety   . Cataracts, bilateral   . COPD (chronic obstructive pulmonary disease) (HCC)   . Depression   . Depression   . FHx: allergies   . Migraine   . Migraines     Past Surgical History:  Procedure Laterality Date  . BREAST ENHANCEMENT SURGERY  1982  . LEFT AND RIGHT HEART CATHETERIZATION WITH CORONARY ANGIOGRAM N/A 02/09/2013   Procedure: LEFT AND RIGHT HEART CATHETERIZATION WITH CORONARY ANGIOGRAM;  Surgeon: Kathleene Hazel, MD;  Location: [facility] CATH LAB;  Service: Cardiovascular;  Laterality: N/A;  . TUBAL LIGATION  1978  . tubal ligation reversal  1989  . vaginal sling  2012    Social History   Socioeconomic History  . Marital status: Married    Spouse name: Not on file  . Number of children: Not on file  . Years of education: Not on file  . Highest education level: Not on file  Occupational History  . Not on file  Tobacco Use  . Smoking status: Former Games developer  . Smokeless tobacco: Never Used  Substance and Sexual Activity  . Alcohol use: Yes    Comment: rare  . Drug use: No  . Sexual activity: Not on file  Other Topics Concern  . Not on file  Social History Narrative  . Not on file   Social Determinants of Health   Financial Resource Strain:   . Difficulty of Paying Living Expenses:   Food Insecurity:   . Worried About Programme researcher, broadcasting/film/video in the Last Year:   . Barista in the Last Year:   Transportation Needs:   . Freight forwarder (Medical):   Marland Kitchen Lack of Transportation (Non-Medical):   Physical Activity:   . Days of Exercise per Week:   . Minutes of Exercise per Session:   Stress:   . Feeling of Stress :    Social Connections:   . Frequency of Communication with Friends and Family:   . Frequency of Social Gatherings with Friends and Family:   . Attends Religious Services:   . Active Member of Clubs or Organizations:   . Attends Banker Meetings:   Marland Kitchen Marital Status:     Family History  Problem Relation Age of Onset  . Thyroid disease Mother   . Cancer Mother   . Cancer Father     Outpatient Encounter Medications as of 01/21/2020  Medication Sig  . cholecalciferol (VITAMIN D) 1000 UNITS tablet Take 2,000 Units by mouth every morning.  Marland Kitchen EPINEPHrine 0.15 MG/0.15ML IJ injection Inject as directed as needed.  . fluticasone (FLONASE) 50 MCG/ACT nasal spray Place 2 sprays into the nose daily.  Marland Kitchen levothyroxine (SYNTHROID) 50 MCG tablet Take 1 tablet (50 mcg total) by mouth daily before breakfast.  . methylphenidate 36 MG  PO CR tablet Take 36 mg by mouth daily.  . montelukast (SINGULAIR) 10 MG tablet   . naproxen sodium (ANAPROX) 220 MG tablet Take 220 mg by mouth 2 (two) times daily with a meal.  . sertraline (ZOLOFT) 100 MG tablet Take 150 mg by mouth at bedtime.   . [DISCONTINUED] levothyroxine (SYNTHROID) 25 MCG tablet Take 1 tablet (25 mcg total) by mouth daily before breakfast.   No facility-administered encounter medications on file as of 01/21/2020.    ALLERGIES: Allergies  Allergen Reactions  . Doxycycline Hives  . Other Anaphylaxis    AGENT:  Green peppers  . Penicillins Hives   VACCINATION STATUS:  There is no immunization history on file for this patient.   HPI    Laurie Garrett  is a patient with the above medical history. -She was recently seen in consultation for hypothyroidism. She was initiated on levothyroxine 25 mcg p.o. daily. She reports compliance with medication. She does not have any new complaints today. She denies palpitations, tremors, nor heat/cold intolerance.  -Prior to her last visit,  she was found to have high TSH of 8.3 on October 04, 2019 with no corresponding T4 nor T3.  On blood work done because of concerns of 20+ pounds of weight gain over the last year, fatigue, and cold intolerance. She has family history of what appears to be hypothyroidism in her mother.  She denies family history of thyroid cancer, although there is significant family history of multiple different cancers.  She denies  voice change. She denies any radiation to the neck.  She also describes dry skin and hair loss.  Pt denies feeling nodules in neck, hoarseness, dysphagia/odynophagia, SOB with lying down. No recent use of iodine supplements.   ROS:  Constitutional: + Steady weight since last visit, - fatigue, no subjective hyperthermia, no subjective hypothermia Eyes: no blurry vision, no xerophthalmia ENT: no sore throat, no nodules palpated in throat, no dysphagia/odynophagia, no hoarseness Cardiovascular: no Chest Pain, no Shortness of Breath, no palpitations, no leg swelling Respiratory: no cough, no SOB Gastrointestinal: no Nausea/Vomiting/Diarhhea Musculoskeletal: no muscle/joint aches Skin: no rashes, + dry skin Neurological: no tremors, no numbness, no tingling, no dizziness Psychiatric: no depression, no anxiety   Physical Exam: BP 116/70   Pulse 84   Ht 4\' 11"  (1.499 m)   Wt 128 lb 9.6 oz (58.3 kg)   BMI 25.97 kg/m  Wt Readings from Last 3 Encounters:  01/21/20 128 lb 9.6 oz (58.3 kg)  11/10/19 129 lb 12.8 oz (58.9 kg)  01/08/18 115 lb (52.2 kg)    Constitutional:  Body mass index is 25.97 kg/m., not in acute distress, normal state of mind Eyes: PERRLA, EOMI, no exophthalmos ENT: moist mucous membranes, + palpable thyroid, no cervical lymphadenopathy Cardiovascular: normal precordial activity, Regular Rate and Rhythm, no Murmur/Rubs/Gallops Respiratory:  adequate breathing efforts, no gross chest deformity, Clear to auscultation bilaterally Gastrointestinal: abdomen soft, Non -tender, No distension, Bowel Sounds  present Musculoskeletal: no gross deformities, strength intact in all four extremities Skin: moist, warm, no rashes Neurological: no tremor with outstretched hands, Deep tendon reflexes normal in all four extremities.   CMP ( most recent) CMP     Component Value Date/Time   NA 142 02/09/2013 0759   K 3.8 02/09/2013 0759   CL 107 02/09/2013 0759   CO2 28 02/09/2013 0759   GLUCOSE 94 02/09/2013 0759   BUN 27 (H) 02/09/2013 0759   CREATININE 0.96 02/09/2013 0759   CALCIUM  9.4 02/09/2013 0759   GFRNONAA 65 (L) 02/09/2013 0759   GFRAA 76 (L) 02/09/2013 0759    Lab Results  Component Value Date   TSH 3.92 01/13/2020   TSH 8.34 (A) 10/04/2019   FREET4 1.1 01/13/2020      ASSESSMENT: 1. Hypothyroidism  PLAN:   -Her previsit thyroid function tests are consistent with treatment effect with improved profile. She would benefit from slight increase in her levothyroxine dose. I discussed and prescribed levothyroxine 50 mcg p.o. daily before breakfast.   - We discussed about the correct intake of her thyroid hormone, on empty stomach at fasting, with water, separated by at least 30 minutes from breakfast and other medications,  and separated by more than 4 hours from calcium, iron, multivitamins, acid reflux medications (PPIs). -Patient is made aware of the fact that thyroid hormone replacement is needed for life, dose to be adjusted by periodic monitoring of thyroid function tests.  -Her thyroid ultrasound is unremarkable.  -She is advised to continue close follow-up with her PCP Lawerance Sabal, PA.     - Time spent on this patient care encounter:  20 minutes of which 50% was spent in  counseling and the rest reviewing  her current and  previous labs / studies and medications  doses and developing a plan for long term care. Mayer Camel Kina  participated in the discussions, expressed understanding, and voiced agreement with the above plans.  All questions were answered to her  satisfaction. she is encouraged to contact clinic should she have any questions or concerns prior to her return visit.   Return in about 4 months (around 05/23/2020) for F/U with Pre-visit Labs.  Marquis Lunch, MD [facility] [address] [place], [place] [address] Phone: [phone]  Fax: [phone]   01/21/2020, 9:06 AM  This note was partially dictated with voice recognition software. Similar sounding words can be transcribed inadequately or may not  be corrected upon review.

## 2020-04-10 ENCOUNTER — Ambulatory Visit (INDEPENDENT_AMBULATORY_CARE_PROVIDER_SITE_OTHER): Payer: BC Managed Care – PPO | Admitting: Neurology

## 2020-04-10 ENCOUNTER — Encounter: Payer: Self-pay | Admitting: Neurology

## 2020-04-10 VITALS — BP 143/75 | HR 80 | Ht <= 58 in | Wt 131.0 lb

## 2020-04-10 DIAGNOSIS — R410 Disorientation, unspecified: Secondary | ICD-10-CM | POA: Diagnosis not present

## 2020-04-10 DIAGNOSIS — F419 Anxiety disorder, unspecified: Secondary | ICD-10-CM | POA: Diagnosis not present

## 2020-04-10 DIAGNOSIS — R413 Other amnesia: Secondary | ICD-10-CM

## 2020-04-10 DIAGNOSIS — F909 Attention-deficit hyperactivity disorder, unspecified type: Secondary | ICD-10-CM

## 2020-04-10 DIAGNOSIS — R4189 Other symptoms and signs involving cognitive functions and awareness: Secondary | ICD-10-CM | POA: Diagnosis not present

## 2020-04-10 NOTE — Patient Instructions (Signed)
MRI of the brain  Blood work Can consider formal memory testing  Memory Compensation Strategies  1. Use "WARM" strategy.  W= write it down  A= associate it  R= repeat it  M= make a mental note  2.   You can keep a Glass blower/designer.  Use a 3-ring notebook with sections for the following: calendar, important names and phone numbers,  medications, doctors' names/phone numbers, lists/reminders, and a section to journal what you did  each day.   3.    Use a calendar to write appointments down.  4.    Write yourself a schedule for the day.  This can be placed on the calendar or in a separate section of the Memory Notebook.  Keeping a  regular schedule can help memory.  5.    Use medication organizer with sections for each day or morning/evening pills.  You may need help loading it  6.    Keep a basket, or pegboard by the door.  Place items that you need to take out with you in the basket or on the pegboard.  You may also want to  include a message board for reminders.  7.    Use sticky notes.  Place sticky notes with reminders in a place where the task is performed.  For example: " turn off the  stove" placed by the stove, "lock the door" placed on the door at eye level, " take your medications" on  the bathroom mirror or by the place where you normally take your medications.  8.    Use alarms/timers.  Use while cooking to remind yourself to check on food or as a reminder to take your medicine, or as a  reminder to make a call, or as a reminder to perform another task, etc.

## 2020-04-10 NOTE — Progress Notes (Signed)
[facility]    Provider:  Dr Lucia Gaskins Requesting Provider: Lawerance Sabal, [place] Primary Care Provider:  Lawerance Sabal, [place]  CC:  Memory changes  HPI:  Laurie Garrett is a 64 y.o. female here as requested by Lawerance Sabal, PA for memory changes. PMHx migraines, depression, COPD, anxiety.  I reviewed Dr. Alfonse Ras notes, patient has a family history of Alzheimer's dementia, she has migraines treated with a daith piercing, the daith piercing fell out and her migraine started again, she had it repierced and no more migraines, she has increased concerns about her cognitive function she recently got lost in [place], she has noticed more frequent instances than ever this year, her father did have Alzheimer's, she did have Covid earlier this year, doing well on her medications, she also has ADHD, and she is treated with Concerta for ADHD and she is also on sertraline, reports memory loss, inability to concentrate, easily distractible, I reviewed physical examination which was normal including neck, respiratory, cardiovascular,   She is here with her husband who also provides information. She did no thave any concerns until she came out of [facility], she went the wrong way, she had to pull over for a minute and she got home. Husband says when she calls she was upset that she got lost. That scared her. But she has no loss of consciousness or alteration of awareness. She had an episode where she was looking at some checks and she had a hard time figuring out whether it was a deposit, she was a little confused, she was answering questions appropriately, there was a lot of anxiety when she calmed down she was able to get it, she does not sleep well but no snoring, she has hx of anxiety and this has happened in the past with anxiety. She appears anxious today. It was in the setting of stress ans has seen a therapist int he past that helped. Not asking the same questions, she is having trouble focusing  again, worse ADHD symptoms, father had dementia but not until later. Ongoing 3-4 years, feel getting worse, husband concerned about her driving and getting lost. Feels it is worse.   Reviewed notes, labs and imaging from outside physicians, which showed:  TSH 01/13/2020: 3.92 normal   Review of Systems: Patient complains of symptoms per HPI as well as the following symptoms: ADHD, anxiety. Pertinent negatives and positives per HPI. All others negative.   Social History   Socioeconomic History  . Marital status: Married    Spouse name: Not on file  . Number of children: 4  . Years of education: associates   . Highest education level: Not on file  Occupational History  . Not on file  Tobacco Use  . Smoking status: Former Smoker    Quit date: 2006    Years since quitting: 15.7  . Smokeless tobacco: Never Used  Substance and Sexual Activity  . Alcohol use: Yes    Comment: rare, one or two drinks a year   . Drug use: No  . Sexual activity: Not on file  Other Topics Concern  . Not on file  Social History Narrative   Lives at home with husband   Right handed   Caffeine: 1 soft drink daily   Social Determinants of Health   Financial Resource Strain:   . Difficulty of Paying Living Expenses: Not on file  Food Insecurity:   . Worried About Programme researcher, broadcasting/film/video in the Last Year: Not on file  .  Ran Out of Food in the Last Year: Not on file  Transportation Needs:   . Lack of Transportation (Medical): Not on file  . Lack of Transportation (Non-Medical): Not on file  Physical Activity:   . Days of Exercise per Week: Not on file  . Minutes of Exercise per Session: Not on file  Stress:   . Feeling of Stress : Not on file  Social Connections:   . Frequency of Communication with Friends and Family: Not on file  . Frequency of Social Gatherings with Friends and Family: Not on file  . Attends Religious Services: Not on file  . Active Member of Clubs or Organizations: Not on file  .  Attends Banker Meetings: Not on file  . Marital Status: Not on file  Intimate Partner Violence:   . Fear of Current or Ex-Partner: Not on file  . Emotionally Abused: Not on file  . Physically Abused: Not on file  . Sexually Abused: Not on file    Family History  Problem Relation Age of Onset  . Thyroid disease Mother   . Cancer Mother        cervical   . Cancer Father        lung  . Prostate cancer Father   . Throat cancer Maternal Grandmother   . Brain cancer Maternal Grandfather   . Other Maternal Grandfather        intestinal cancer  . Cancer Paternal Grandmother   . Throat cancer Paternal Grandfather     Past Medical History:  Diagnosis Date  . Acid reflux   . Anxiety   . Cataracts, bilateral   . COPD (chronic obstructive pulmonary disease) (HCC)   . Depression   . Depression   . FHx: allergies   . Migraine   . Migraines     Patient Active Problem List   Diagnosis Date Noted  . Hypothyroidism 01/21/2020  . Other spondylosis with radiculopathy, cervical region 01/13/2018  . Spinal stenosis of cervical region 01/13/2018  . Chest pain 02/08/2013    Past Surgical History:  Procedure Laterality Date  . BREAST ENHANCEMENT SURGERY  1982  . LEFT AND RIGHT HEART CATHETERIZATION WITH CORONARY ANGIOGRAM N/A 02/09/2013   Procedure: LEFT AND RIGHT HEART CATHETERIZATION WITH CORONARY ANGIOGRAM;  Surgeon: Kathleene Hazel, MD;  Location: [facility] CATH LAB;  Service: Cardiovascular;  Laterality: N/A;  . NECK SURGERY  2019  . TUBAL LIGATION  1978  . tubal ligation reversal  1989  . vaginal sling  2012    Current Outpatient Medications  Medication Sig Dispense Refill  . albuterol (VENTOLIN HFA) 108 (90 Base) MCG/ACT inhaler Inhale 1-2 puffs into the lungs every 4 (four) hours as needed for wheezing or shortness of breath.    . cholecalciferol (VITAMIN D) 1000 UNITS tablet Take 1,000 Units by mouth every morning.     Marland Kitchen EPINEPHrine 0.15 MG/0.15ML IJ injection  Inject as directed as needed.    . fluticasone (FLONASE) 50 MCG/ACT nasal spray Place 2 sprays into the nose daily.    Marland Kitchen levothyroxine (SYNTHROID) 50 MCG tablet Take 1 tablet (50 mcg total) by mouth daily before breakfast. 90 tablet 1  . methylphenidate 36 MG PO CR tablet Take 36 mg by mouth daily.    . montelukast (SINGULAIR) 10 MG tablet Take 10 mg by mouth daily.     . naproxen sodium (ANAPROX) 220 MG tablet Take 220 mg by mouth 2 (two) times daily with a meal.    .  sertraline (ZOLOFT) 100 MG tablet Take 150 mg by mouth at bedtime.      No current facility-administered medications for this visit.    Allergies as of 04/10/2020 - Review Complete 04/10/2020  Allergen Reaction Noted  . Doxycycline Hives 12/07/2014  . Other Anaphylaxis 02/09/2013  . Penicillins Hives 02/08/2013    Vitals: BP (!) 143/75 (BP Location: Right Arm, Patient Position: Sitting)   Pulse 80   Ht 4\' 10"  (1.473 m)   Wt 131 lb (59.4 kg)   BMI 27.38 kg/m  Last Weight:  Wt Readings from Last 1 Encounters:  04/10/20 131 lb (59.4 kg)   Last Height:   Ht Readings from Last 1 Encounters:  04/10/20 4\' 10"  (1.473 m)     Physical exam: Exam: Gen: Anxious                  CV: RRR, no MRG. No Carotid Bruits. No peripheral edema, warm, nontender Eyes: Conjunctivae clear without exudates or hemorrhage  Neuro: Detailed Neurologic Exam  Speech:    Speech is normal; fluent and spontaneous with normal comprehension.  Cognition:  MMSE - Mini Mental State Exam 04/10/2020  Orientation to time 5  Orientation to Place 5  Registration 3  Attention/ Calculation 5  Recall 3  Language- name 2 objects 2  Language- repeat 1  Language- follow 3 step command 3  Language- read & follow direction 1  Write a sentence 1  Copy design 1  Total score 30       The patient is oriented to person, place, and time;     recent and remote memory intact;     language fluent;     normal attention, concentration,     fund of  knowledge Cranial Nerves:    The pupils are equal, round, and reactive to light. The fundi are flat. Visual fields are full to finger confrontation. Extraocular movements are intact. Trigeminal sensation is intact and the muscles of mastication are normal. The face is symmetric. The palate elevates in the midline. Hearing intact. Voice is normal. Shoulder shrug is normal. The tongue has normal motion without fasciculations.   Coordination:    Normal finger to nose and heel to shin.  Gait:    Normal native gait  Motor Observation:    No asymmetry, no atrophy, and no involuntary movements noted. Tone:    Normal muscle tone.    Posture:    Posture is normal. normal erect    Strength:    Strength is V/V in the upper and lower limbs.      Sensation: intact to LT     Reflex Exam:  DTR's:    Deep tendon reflexes in the upper and lower extremities are brisk bilaterally.   Toes:    The toes are downgoing bilaterally.   Clonus:    Clonus is absent.    Assessment/Plan:  64 y.o. female here as requested by 06/10/2020, PA for memory changes. PMHx migraines, depression, COPD, anxiety.  I suspect that the etiology of patient's symptoms include normal cognitive aging, ADHD and what appears to be significant anxiety.  However given her episodes of confusion I do think it is important to get an MRI of the brain to rule out things like seizure focus, strokes, or other etiology but I think anxiety has a lot to do with her episodes.  Still we will get an MRI of the brain and get some blood work today.  I discussed this with him  at length, if they feel as though they really want a proceed towards formal memory testing I can refer but I recommended seeing primary care to discuss adjustments to ADHD medications and management of anxiety.  She is also not sleeping well, recommend sleep therapy or again adjusting ADHD medications with may be interfering with her sleep.  She can discuss possible referral  to sleep studies in the future if needed and clinically warranted based on PCPs evaluation.    Orders Placed This Encounter  Procedures  . MR BRAIN W WO CONTRAST  . B12 and Folate Panel  . Methylmalonic acid, serum  . Vitamin B1  . CBC  . Comprehensive metabolic panel   No orders of the defined types were placed in this encounter.   Cc: Lawerance SabalWorley, Miranda, PA,  [place]unters Creek VillageWorley, Langdon PlaceMiranda, [place]  Naomie DeanAntonia Azreal Stthomas, MD  [facility] [address] [place], [place] [id number]  Phone [phone] Fax [phone]

## 2020-04-11 ENCOUNTER — Telehealth: Payer: Self-pay | Admitting: Neurology

## 2020-04-11 NOTE — Telephone Encounter (Signed)
spoke to the patient due to the cost she is going to hold off. I did offer her the payment plan.  Yet[employer] Numbers: [id number] (exp. 04/11/20 to 05/01/20)

## 2020-04-15 LAB — CBC
Hematocrit: 38.4 % (ref 34.0–46.6)
Hemoglobin: 12.6 g/dL (ref 11.1–15.9)
MCH: 29.1 pg (ref 26.6–33.0)
MCHC: 32.8 g/dL (ref 31.5–35.7)
MCV: 89 fL (ref 79–97)
Platelets: 196 10*3/uL (ref 150–450)
RBC: 4.33 x10E6/uL (ref 3.77–5.28)
RDW: 13.1 % (ref 11.7–15.4)
WBC: 6.8 10*3/uL (ref 3.4–10.8)

## 2020-04-15 LAB — COMPREHENSIVE METABOLIC PANEL
ALT: 33 IU/L — ABNORMAL HIGH (ref 0–32)
AST: 31 IU/L (ref 0–40)
Albumin/Globulin Ratio: 2 (ref 1.2–2.2)
Albumin: 4.7 g/dL (ref 3.8–4.8)
Alkaline Phosphatase: 177 IU/L — ABNORMAL HIGH (ref 44–121)
BUN/Creatinine Ratio: 26 (ref 12–28)
BUN: 19 mg/dL (ref 8–27)
Bilirubin Total: 0.3 mg/dL (ref 0.0–1.2)
CO2: 28 mmol/L (ref 20–29)
Calcium: 9.5 mg/dL (ref 8.7–10.3)
Chloride: 102 mmol/L (ref 96–106)
Creatinine, Ser: 0.74 mg/dL (ref 0.57–1.00)
GFR calc Af Amer: 100 mL/min/{1.73_m2} (ref >59)
GFR calc non Af Amer: 87 mL/min/{1.73_m2} (ref >59)
Globulin, Total: 2.3 g/dL (ref 1.5–4.5)
Glucose: 85 mg/dL (ref 65–99)
Potassium: 4.2 mmol/L (ref 3.5–5.2)
Sodium: 143 mmol/L (ref 134–144)
Total Protein: 7 g/dL (ref 6.0–8.5)

## 2020-04-15 LAB — B12 AND FOLATE PANEL
Folate: 3.7 ng/mL (ref >3.0)
Vitamin B-12: 255 pg/mL (ref 232–1245)

## 2020-04-15 LAB — METHYLMALONIC ACID, SERUM: Methylmalonic Acid: 582 nmol/L — ABNORMAL HIGH (ref 0–378)

## 2020-04-15 LAB — VITAMIN B1: Thiamine: 171.9 nmol/L (ref 66.5–200.0)

## 2020-04-17 ENCOUNTER — Telehealth: Payer: Self-pay | Admitting: *Deleted

## 2020-04-17 NOTE — Telephone Encounter (Addendum)
I called the pt and discussed lab results from Dr Lucia Gaskins. Pt aware that she has B12 deficiency. We discussed the effects low B12 can cause and I also advised her of Dr Trevor Mace recommendation to take 1000-2000 mcg PO B12 daily, purchased OTC. We also discussed that pt should follow up with her PCP and have B12 rechecked in 8 weeks, take B12 at least a year if not longer and if she stops taking the supplement they will need to watch her level. The pt verbalized understanding and appreciation regarding the entire message and she stated she would start taking the B12.   Results faxed to PCP.

## 2020-04-17 NOTE — Telephone Encounter (Signed)
-----   Message from Anson Fret, MD sent at 04/16/2020  9:25 PM EDT ----- Patient is B12 deficient. Please forward to pcp. Let patient know.  B12 deficiency can cause tingling or numbness to the fingers and toes, difficulty walking, mood changes, depression, memory loss, disorientation and, in severe cases, dementia. Recommend 1000-2000 mcg B12 daily. recheck B12 in 8 weeks with pcp.I would recommend taking B12 for at least a year if not longer but you will have to watch levels if you ever stop taking B12 supplement. Follow with Lawerance Sabal for this. Dr. Lucia Gaskins

## 2020-05-17 LAB — T4, FREE: Free T4: 1.41 ng/dL (ref 0.82–1.77)

## 2020-05-17 LAB — TSH: TSH: 2.46 u[IU]/mL (ref 0.450–4.500)

## 2020-05-23 ENCOUNTER — Encounter: Payer: Self-pay | Admitting: Nurse Practitioner

## 2020-05-23 ENCOUNTER — Other Ambulatory Visit: Payer: Self-pay

## 2020-05-23 ENCOUNTER — Ambulatory Visit (INDEPENDENT_AMBULATORY_CARE_PROVIDER_SITE_OTHER): Payer: BC Managed Care – PPO | Admitting: Nurse Practitioner

## 2020-05-23 VITALS — BP 129/79 | HR 84 | Temp 97.8°F | Wt 131.0 lb

## 2020-05-23 DIAGNOSIS — E039 Hypothyroidism, unspecified: Secondary | ICD-10-CM | POA: Diagnosis not present

## 2020-05-23 NOTE — Progress Notes (Signed)
05/23/2020, 5:05 PM   Endocrinology follow-up note  Laurie Garrett is a 64 y.o.-year-old female patient being seen in follow up after being seen in consultation for hypothyroidism referred by Lawerance Sabal, PA.  SUBJECTIVE:  Past Medical History:  Diagnosis Date  . Acid reflux   . Anxiety   . Cataracts, bilateral   . COPD (chronic obstructive pulmonary disease) (HCC)   . Depression   . Depression   . FHx: allergies   . Migraine   . Migraines     Past Surgical History:  Procedure Laterality Date  . BREAST ENHANCEMENT SURGERY  1982  . LEFT AND RIGHT HEART CATHETERIZATION WITH CORONARY ANGIOGRAM N/A 02/09/2013   Procedure: LEFT AND RIGHT HEART CATHETERIZATION WITH CORONARY ANGIOGRAM;  Surgeon: Kathleene Hazel, MD;  Location: [facility] CATH LAB;  Service: Cardiovascular;  Laterality: N/A;  . NECK SURGERY  2019  . TUBAL LIGATION  1978  . tubal ligation reversal  1989  . vaginal sling  2012    Social History   Socioeconomic History  . Marital status: Married    Spouse name: Not on file  . Number of children: 4  . Years of education: associates   . Highest education level: Not on file  Occupational History  . Not on file  Tobacco Use  . Smoking status: Former Smoker    Quit date: 2006    Years since quitting: 15.9  . Smokeless tobacco: Never Used  Substance and Sexual Activity  . Alcohol use: Yes    Comment: rare, one or two drinks a year   . Drug use: No  . Sexual activity: Not on file  Other Topics Concern  . Not on file  Social History Narrative   Lives at home with husband   Right handed   Caffeine: 1 soft drink daily   Social Determinants of Health   Financial Resource Strain:   . Difficulty of Paying Living Expenses: Not on file  Food Insecurity:   . Worried About Programme researcher, broadcasting/film/video in the Last Year: Not on file  . Ran Out of Food in the Last Year: Not on file   Transportation Needs:   . Lack of Transportation (Medical): Not on file  . Lack of Transportation (Non-Medical): Not on file  Physical Activity:   . Days of Exercise per Week: Not on file  . Minutes of Exercise per Session: Not on file  Stress:   . Feeling of Stress : Not on file  Social Connections:   . Frequency of Communication with Friends and Family: Not on file  . Frequency of Social Gatherings with Friends and Family: Not on file  . Attends Religious Services: Not on file  . Active Member of Clubs or Organizations: Not on file  . Attends Banker Meetings: Not on file  . Marital Status: Not on file    Family History  Problem Relation Age of Onset  . Thyroid disease Mother   . Cancer Mother        cervical   . Cancer  Father        lung  . Prostate cancer Father   . Throat cancer Maternal Grandmother   . Brain cancer Maternal Grandfather   . Other Maternal Grandfather        intestinal cancer  . Cancer Paternal Grandmother   . Throat cancer Paternal Grandfather     Outpatient Encounter Medications as of 05/23/2020  Medication Sig  . albuterol (VENTOLIN HFA) 108 (90 Base) MCG/ACT inhaler Inhale 1-2 puffs into the lungs every 4 (four) hours as needed for wheezing or shortness of breath.  . cholecalciferol (VITAMIN D) 1000 UNITS tablet Take 1,000 Units by mouth every morning.   Marland Kitchen EPINEPHrine 0.15 MG/0.15ML IJ injection Inject as directed as needed.  . fluticasone (FLONASE) 50 MCG/ACT nasal spray Place 2 sprays into the nose daily.  Marland Kitchen levothyroxine (SYNTHROID) 50 MCG tablet Take 1 tablet (50 mcg total) by mouth daily before breakfast.  . methylphenidate 36 MG PO CR tablet Take 36 mg by mouth daily.  . montelukast (SINGULAIR) 10 MG tablet Take 10 mg by mouth daily.   . naproxen sodium (ANAPROX) 220 MG tablet Take 220 mg by mouth 2 (two) times daily with a meal.  . sertraline (ZOLOFT) 100 MG tablet Take 150 mg by mouth at bedtime.    No facility-administered  encounter medications on file as of 05/23/2020.    ALLERGIES: Allergies  Allergen Reactions  . Doxycycline Hives  . Other Anaphylaxis    AGENT:  Green peppers  . Penicillins Hives   VACCINATION STATUS:  There is no immunization history on file for this patient.   HPI  Thyroid Problem Presents for follow-up visit. Symptoms include fatigue. Patient reports no anxiety, cold intolerance, constipation, depressed mood, heat intolerance, palpitations, tremors, weight gain or weight loss. The symptoms have been improving.    Laurie Garrett  is a patient with the above medical history.  -Prior to her last visit,  she was found to have high TSH of 8.3 on October 04, 2019 with no corresponding T4 nor T3.  On blood work done because of concerns of 20+ pounds of weight gain over the last year, fatigue, and cold intolerance. She has family history of what appears to be hypothyroidism in her mother.  She denies family history of thyroid cancer, although there is significant family history of multiple different cancers.  She denies any radiation to the neck or use of iodine supplements.  Pt denies feeling nodules in neck, hoarseness, dysphagia/odynophagia, SOB with lying down.   Review of systems  Constitutional: + Minimally fluctuating body weight,  current Body mass index is 27.38 kg/m. , + mild fatigue, no subjective hyperthermia, no subjective hypothermia Eyes: no blurry vision, no xerophthalmia ENT: no sore throat, no nodules palpated in throat, no dysphagia/odynophagia, no hoarseness Cardiovascular: no chest pain, no shortness of breath, no palpitations, no leg swelling Respiratory: no cough, no shortness of breath Gastrointestinal: no nausea/vomiting/diarrhea Musculoskeletal: no muscle/joint aches Skin: no rashes, no hyperemia Neurological: no tremors, no numbness, no tingling, no dizziness Psychiatric: no depression, no anxiety    OBJECTIVE:  BP 129/79   Pulse 84   Temp 97.8 F  (36.6 C)   Wt 131 lb (59.4 kg)   SpO2 94%   BMI 27.38 kg/m  Wt Readings from Last 3 Encounters:  05/23/20 131 lb (59.4 kg)  04/10/20 131 lb (59.4 kg)  01/21/20 128 lb 9.6 oz (58.3 kg)   BP Readings from Last 3 Encounters:  05/23/20 129/79  04/10/20 Marland Kitchen)  143/75  01/21/20 116/70    Physical Exam- Limited  Constitutional:  Body mass index is 27.38 kg/m. , not in acute distress, normal state of mind Eyes:  EOMI, no exophthalmos Neck: Supple Thyroid: No gross goiter Cardiovascular: RRR, no murmers, rubs, or gallops, no edema Respiratory: Adequate breathing efforts, no crackles, rales, rhonchi, or wheezing Musculoskeletal: no gross deformities, strength intact in all four extremities, no gross restriction of joint movements Skin:  no rashes, no hyperemia Neurological: no tremor with outstretched hands    CMP ( most recent) CMP     Component Value Date/Time   NA 143 04/10/2020 1535   K 4.2 04/10/2020 1535   CL 102 04/10/2020 1535   CO2 28 04/10/2020 1535   GLUCOSE 85 04/10/2020 1535   GLUCOSE 94 02/09/2013 0759   BUN 19 04/10/2020 1535   CREATININE 0.74 04/10/2020 1535   CALCIUM 9.5 04/10/2020 1535   PROT 7.0 04/10/2020 1535   ALBUMIN 4.7 04/10/2020 1535   AST 31 04/10/2020 1535   ALT 33 (H) 04/10/2020 1535   ALKPHOS 177 (H) 04/10/2020 1535   BILITOT 0.3 04/10/2020 1535   GFRNONAA 87 04/10/2020 1535   GFRAA 100 04/10/2020 1535    Lab Results  Component Value Date   TSH 2.460 05/16/2020   TSH 3.92 01/13/2020   TSH 8.34 (A) 10/04/2019   FREET4 1.41 05/16/2020   FREET4 1.1 01/13/2020      ASSESSMENT / PLAN: 1. Hypothyroidism  -Her previsit TFTs are consistent with appropriate hormone replacement.  She is advised to continue Levothyroxine 50 mcg po daily before breakfast.   - We discussed about the correct intake of her thyroid hormone, on empty stomach at fasting, with water, separated by at least 30 minutes from breakfast and other medications,  and  separated by more than 4 hours from calcium, iron, multivitamins, acid reflux medications (PPIs). -Patient is made aware of the fact that thyroid hormone replacement is needed for life, dose to be adjusted by periodic monitoring of thyroid function tests.   -She is advised to continue close follow-up with her PCP Lawerance Sabal, PA.      - Time spent on this patient care encounter:  20 minutes of which 50% was spent in  counseling and the rest reviewing  her current and  previous labs / studies and medications  doses and developing a plan for long term care. Mayer Camel Otani  participated in the discussions, expressed understanding, and voiced agreement with the above plans.  All questions were answered to her satisfaction. she is encouraged to contact clinic should she have any questions or concerns prior to her return visit.    Follow Up Plan: Return in about 4 months (around 09/20/2020) for Thyroid follow up, Previsit labs.    Ronny Bacon, [facility] [address] [place], [place] [address] Phone: [phone] Fax: [phone]  05/23/2020, 5:05 PM

## 2020-05-23 NOTE — Patient Instructions (Signed)

## 2020-08-18 ENCOUNTER — Other Ambulatory Visit: Payer: Self-pay | Admitting: "Endocrinology

## 2020-09-21 ENCOUNTER — Ambulatory Visit: Payer: BC Managed Care – PPO | Admitting: Nurse Practitioner

## 2020-11-10 LAB — TSH: TSH: 2.71 u[IU]/mL (ref 0.450–4.500)

## 2020-11-10 LAB — T4, FREE: Free T4: 1.35 ng/dL (ref 0.82–1.77)

## 2020-11-15 ENCOUNTER — Ambulatory Visit: Payer: BC Managed Care – PPO | Admitting: Nurse Practitioner

## 2020-11-19 ENCOUNTER — Other Ambulatory Visit: Payer: Self-pay | Admitting: "Endocrinology

## 2021-06-29 IMAGING — US US THYROID
1 series · 14 of 25 positions shown · non-contrast
Comparison: None.

CLINICAL DATA: Goiter.  Hypothyroidism.

EXAM:
THYROID ULTRASOUND
TECHNIQUE: Ultrasound examination of the thyroid gland and adjacent soft
tissues was performed.

[Series 1: us thyroid · 0.05mm/px · 14 of 55 slices shown]
[im 1/55]
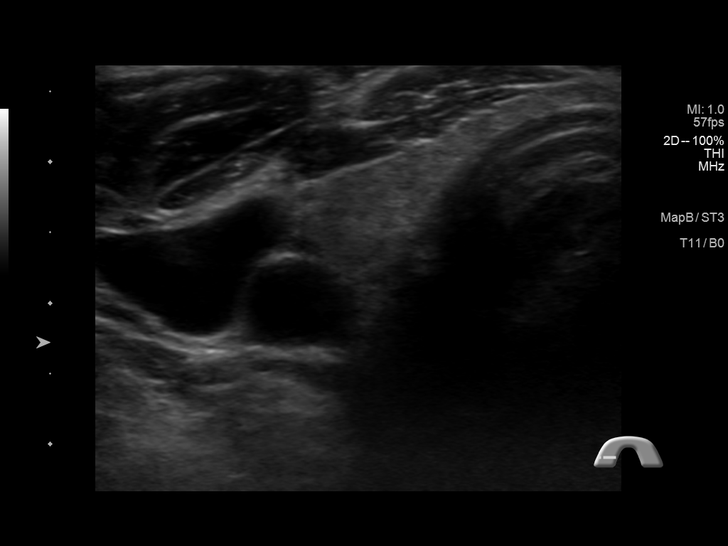
[im 5/55]
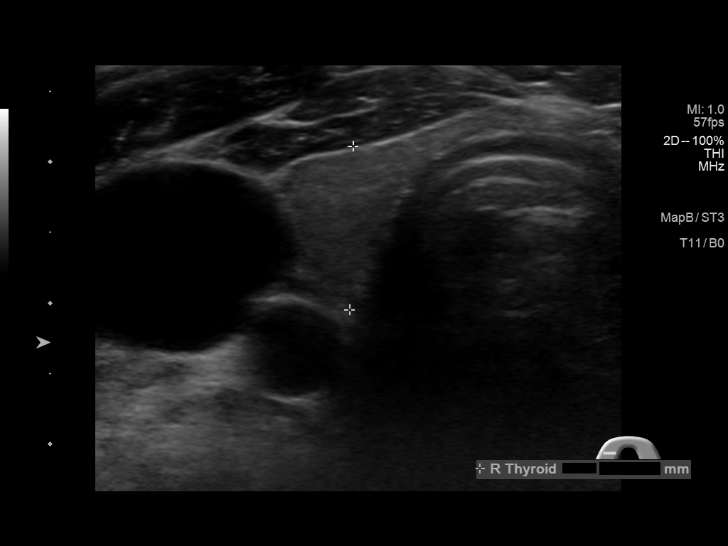
[im 10/55]
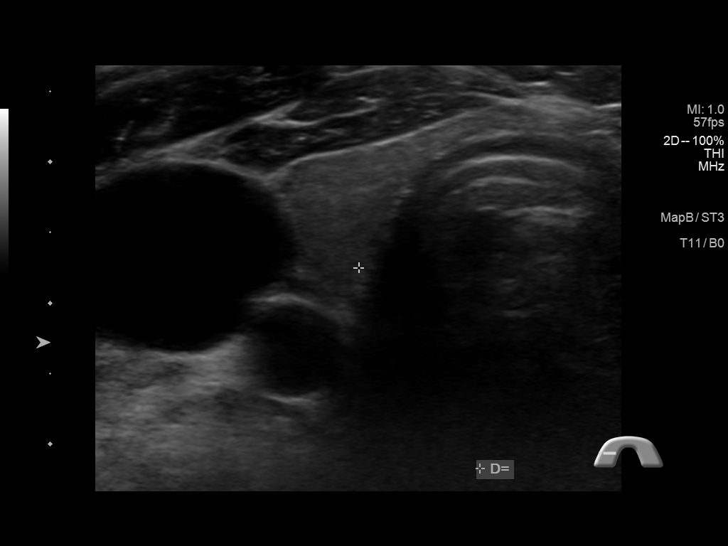
[im 14/55]
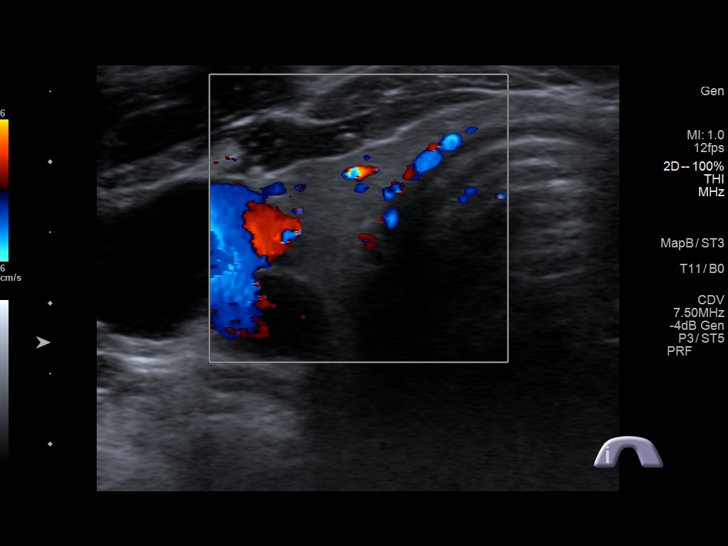
[im 19/55]
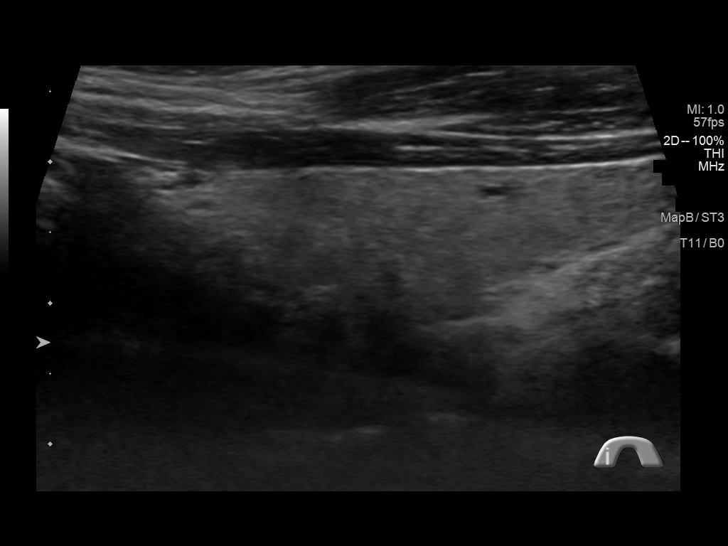
[im 21/55]
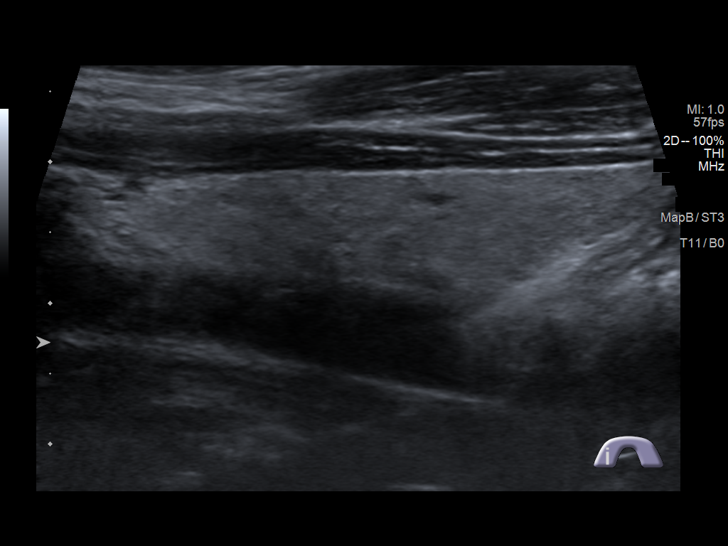
[im 25/55]
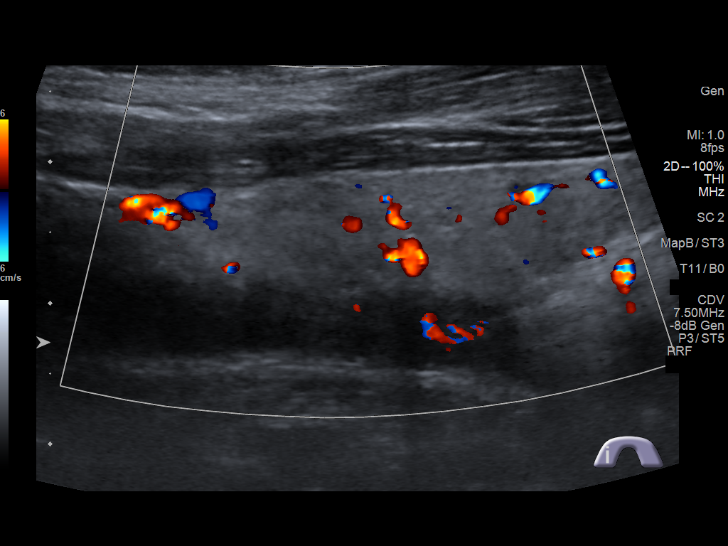
[im 30/55]
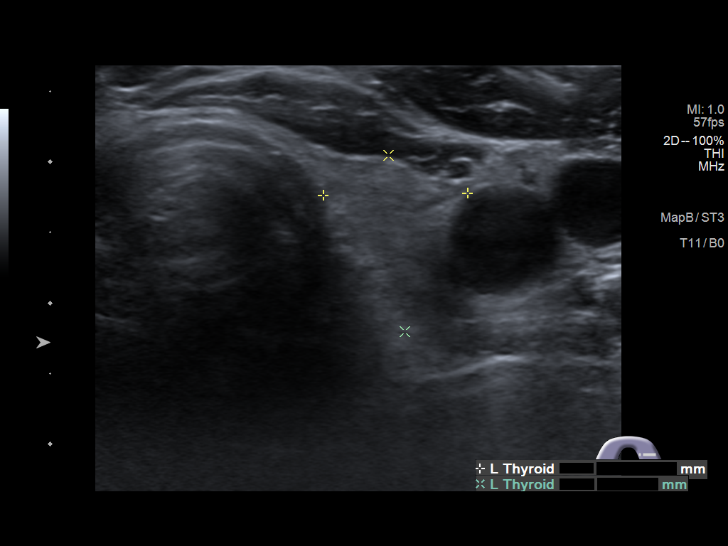
[im 34/55]
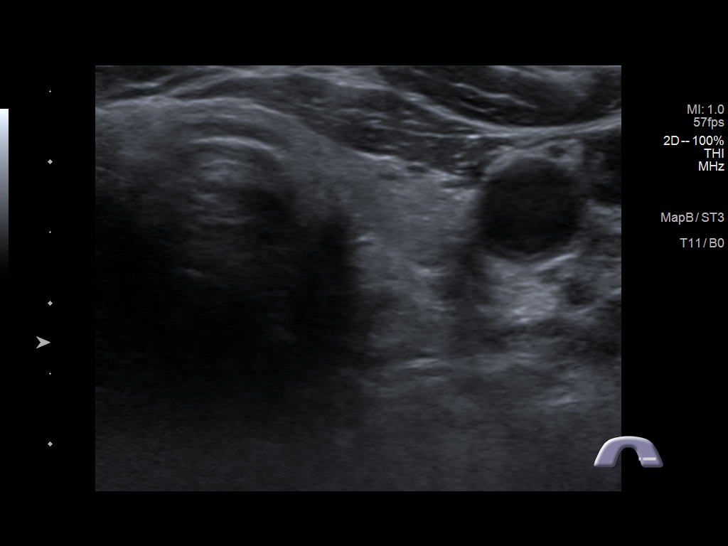
[im 37/55]
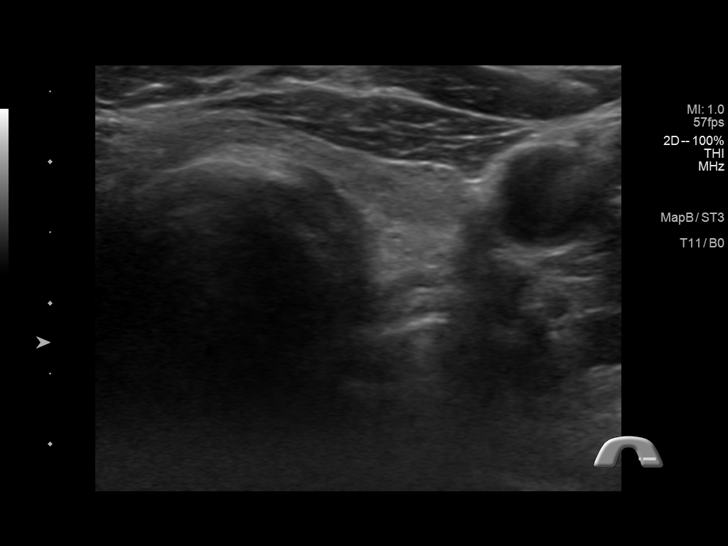
[im 41/55]
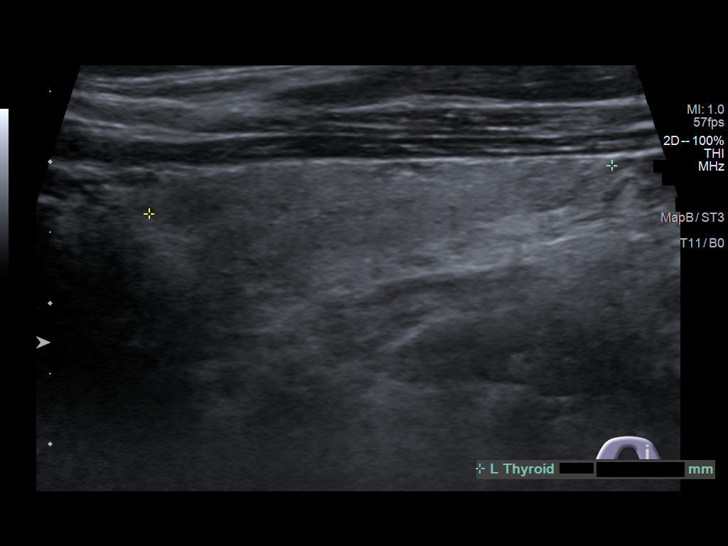
[im 46/55]
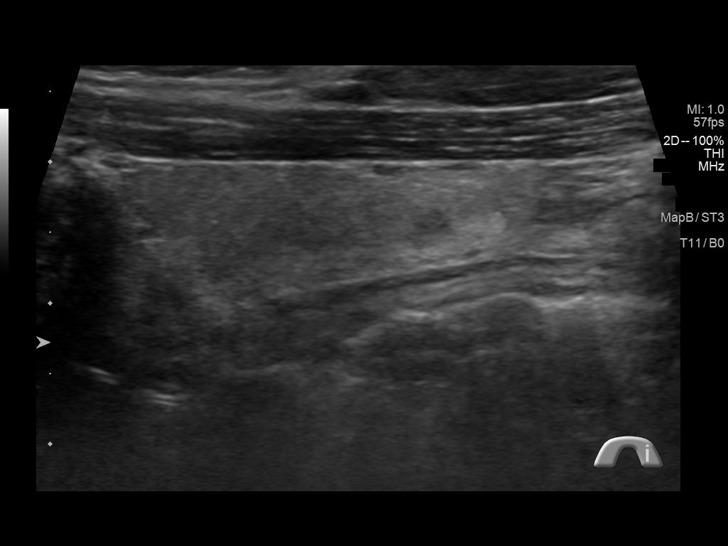
[im 50/55]
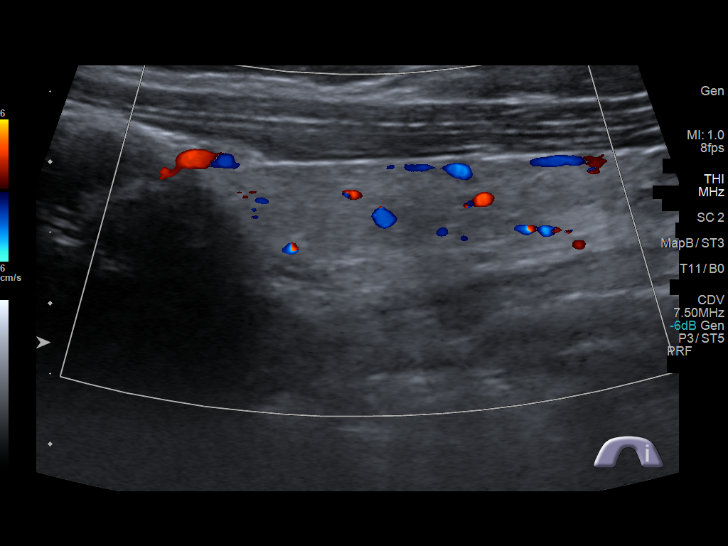
[im 55/55]
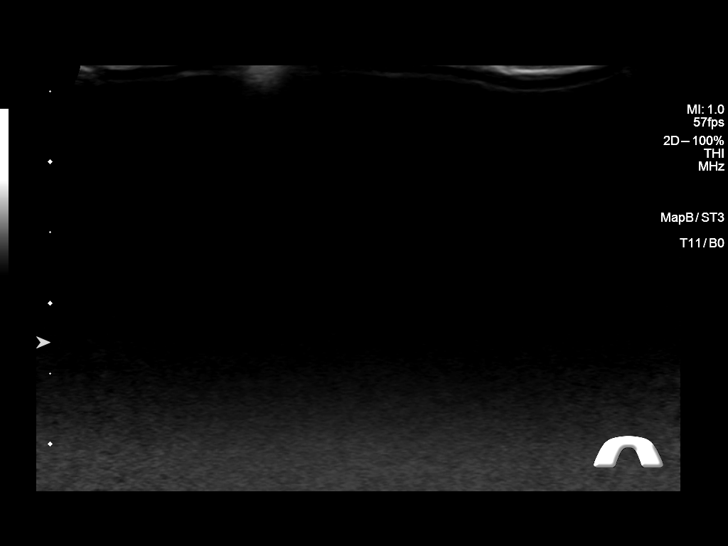

[14 of 25 positions shown; findings below may reference images not displayed]

FINDINGS: Parenchymal Echotexture: Mildly heterogenous

Isthmus: 0.2 cm

Right lobe: 3.8 x 1.0 x 1.1 cm

Left lobe: 3.3 x 1.0 x 1.2 cm

_________________________________________________________

Estimated total number of nodules >/= 1 cm: 0

Number of spongiform nodules >/=  2 cm not described below (TR1): 0

Number of mixed cystic and solid nodules >/= 1.5 cm not described
below (TR2): 0

_________________________________________________________

No discrete nodules are seen within the thyroid gland. No abnormal
lymph nodes identified.
IMPRESSION: Unremarkable thyroid ultrasound. Mildly heterogeneous thyroid
parenchyma.

The above is in keeping with the ACR TI-RADS recommendations - [HOSPITAL] 3076;[DATE].

## 2023-02-12 DIAGNOSIS — I10 Essential (primary) hypertension: Secondary | ICD-10-CM | POA: Diagnosis not present

## 2023-02-12 DIAGNOSIS — M25551 Pain in right hip: Secondary | ICD-10-CM | POA: Diagnosis not present

## 2023-02-12 DIAGNOSIS — M25552 Pain in left hip: Secondary | ICD-10-CM | POA: Diagnosis not present

## 2023-02-12 DIAGNOSIS — Z6827 Body mass index (BMI) 27.0-27.9, adult: Secondary | ICD-10-CM | POA: Diagnosis not present

## 2023-02-12 DIAGNOSIS — E039 Hypothyroidism, unspecified: Secondary | ICD-10-CM | POA: Diagnosis not present

## 2023-02-12 DIAGNOSIS — F900 Attention-deficit hyperactivity disorder, predominantly inattentive type: Secondary | ICD-10-CM | POA: Diagnosis not present

## 2023-03-21 DIAGNOSIS — Z6826 Body mass index (BMI) 26.0-26.9, adult: Secondary | ICD-10-CM | POA: Diagnosis not present

## 2023-03-21 DIAGNOSIS — I10 Essential (primary) hypertension: Secondary | ICD-10-CM | POA: Diagnosis not present

## 2023-03-21 DIAGNOSIS — F900 Attention-deficit hyperactivity disorder, predominantly inattentive type: Secondary | ICD-10-CM | POA: Diagnosis not present

## 2023-03-21 DIAGNOSIS — J4540 Moderate persistent asthma, uncomplicated: Secondary | ICD-10-CM | POA: Diagnosis not present

## 2023-03-21 DIAGNOSIS — E039 Hypothyroidism, unspecified: Secondary | ICD-10-CM | POA: Diagnosis not present

## 2023-03-21 DIAGNOSIS — M25551 Pain in right hip: Secondary | ICD-10-CM | POA: Diagnosis not present

## 2023-05-27 DIAGNOSIS — Z6826 Body mass index (BMI) 26.0-26.9, adult: Secondary | ICD-10-CM | POA: Diagnosis not present

## 2023-05-27 DIAGNOSIS — M545 Low back pain, unspecified: Secondary | ICD-10-CM | POA: Diagnosis not present

## 2023-05-27 DIAGNOSIS — Z23 Encounter for immunization: Secondary | ICD-10-CM | POA: Diagnosis not present

## 2023-05-27 DIAGNOSIS — N3000 Acute cystitis without hematuria: Secondary | ICD-10-CM | POA: Diagnosis not present

## 2023-05-27 DIAGNOSIS — F900 Attention-deficit hyperactivity disorder, predominantly inattentive type: Secondary | ICD-10-CM | POA: Diagnosis not present

## 2023-05-27 DIAGNOSIS — I10 Essential (primary) hypertension: Secondary | ICD-10-CM | POA: Diagnosis not present

## 2023-08-22 DIAGNOSIS — Z13220 Encounter for screening for lipoid disorders: Secondary | ICD-10-CM | POA: Diagnosis not present

## 2023-08-22 DIAGNOSIS — R7989 Other specified abnormal findings of blood chemistry: Secondary | ICD-10-CM | POA: Diagnosis not present

## 2023-08-22 DIAGNOSIS — Z0000 Encounter for general adult medical examination without abnormal findings: Secondary | ICD-10-CM | POA: Diagnosis not present

## 2023-08-22 DIAGNOSIS — I10 Essential (primary) hypertension: Secondary | ICD-10-CM | POA: Diagnosis not present

## 2023-08-22 DIAGNOSIS — R739 Hyperglycemia, unspecified: Secondary | ICD-10-CM | POA: Diagnosis not present
